# Patient Record
Sex: Female | Born: 1970 | Race: White | Hispanic: No | Marital: Married | Smoking: Never smoker
Health system: Southern US, Community
[De-identification: ages and names within clinical notes are randomized; demographics above are authoritative.]

## PROBLEM LIST (undated history)

## (undated) DIAGNOSIS — G43909 Migraine, unspecified, not intractable, without status migrainosus: Secondary | ICD-10-CM

## (undated) DIAGNOSIS — N750 Cyst of Bartholin's gland: Secondary | ICD-10-CM

## (undated) HISTORY — PX: APPENDECTOMY: SHX54

## (undated) HISTORY — PX: LASIK: SHX215

## (undated) HISTORY — DX: Cyst of Bartholin's gland: N75.0

## (undated) HISTORY — PX: TONSILLECTOMY: SUR1361

## (undated) HISTORY — PX: BREAST BIOPSY: SHX20

---

## 2005-05-12 ENCOUNTER — Ambulatory Visit: Payer: Self-pay

## 2005-05-28 ENCOUNTER — Ambulatory Visit: Payer: Self-pay

## 2005-11-20 ENCOUNTER — Ambulatory Visit: Payer: Self-pay | Admitting: General Surgery

## 2006-06-15 ENCOUNTER — Ambulatory Visit: Payer: Self-pay | Admitting: General Surgery

## 2006-12-31 ENCOUNTER — Encounter: Admission: RE | Admit: 2006-12-31 | Discharge: 2006-12-31 | Payer: Self-pay | Admitting: Family Medicine

## 2007-06-28 ENCOUNTER — Ambulatory Visit: Payer: Self-pay

## 2008-07-04 ENCOUNTER — Ambulatory Visit: Payer: Self-pay

## 2008-07-24 ENCOUNTER — Ambulatory Visit: Payer: Self-pay | Admitting: Unknown Physician Specialty

## 2008-07-31 ENCOUNTER — Ambulatory Visit: Payer: Self-pay | Admitting: Unknown Physician Specialty

## 2009-07-09 ENCOUNTER — Ambulatory Visit: Payer: Self-pay

## 2009-11-25 ENCOUNTER — Ambulatory Visit: Payer: Self-pay | Admitting: Family Medicine

## 2010-07-15 ENCOUNTER — Ambulatory Visit: Payer: Self-pay | Admitting: Unknown Physician Specialty

## 2012-01-08 ENCOUNTER — Ambulatory Visit: Payer: Self-pay | Admitting: Orthopedic Surgery

## 2012-01-25 ENCOUNTER — Encounter: Payer: Self-pay | Admitting: Orthopedic Surgery

## 2012-02-14 ENCOUNTER — Encounter: Payer: Self-pay | Admitting: Orthopedic Surgery

## 2012-03-16 ENCOUNTER — Encounter: Payer: Self-pay | Admitting: Orthopedic Surgery

## 2014-12-21 ENCOUNTER — Other Ambulatory Visit: Payer: Self-pay | Admitting: *Deleted

## 2014-12-21 ENCOUNTER — Inpatient Hospital Stay
Admission: RE | Admit: 2014-12-21 | Discharge: 2014-12-21 | Disposition: A | Discharge status: Home / Self Care | Payer: Self-pay | Source: Ambulatory Visit | Attending: *Deleted | Admitting: *Deleted

## 2014-12-21 DIAGNOSIS — Z9289 Personal history of other medical treatment: Secondary | ICD-10-CM

## 2015-01-11 ENCOUNTER — Ambulatory Visit
Admission: EM | Admit: 2015-01-11 | Discharge: 2015-01-11 | Disposition: A | Discharge status: Home / Self Care | Payer: BC Managed Care – PPO | Attending: Family Medicine | Admitting: Family Medicine

## 2015-01-11 ENCOUNTER — Encounter: Payer: Self-pay | Admitting: Emergency Medicine

## 2015-01-11 DIAGNOSIS — J0101 Acute recurrent maxillary sinusitis: Secondary | ICD-10-CM | POA: Diagnosis not present

## 2015-01-11 DIAGNOSIS — H6593 Unspecified nonsuppurative otitis media, bilateral: Secondary | ICD-10-CM | POA: Diagnosis not present

## 2015-01-11 MED ORDER — AMOXICILLIN-POT CLAVULANATE 875-125 MG PO TABS: 1.0000 | ORAL_TABLET | Freq: Two times a day (BID) | ORAL | Status: DC

## 2015-01-11 MED ORDER — ACETAMINOPHEN 500 MG PO TABS: 1000.0000 mg | ORAL_TABLET | Freq: Four times a day (QID) | ORAL | Status: AC | PRN

## 2015-01-11 MED ORDER — FLUTICASONE PROPIONATE 50 MCG/ACT NA SUSP: 1.0000 | Freq: Two times a day (BID) | NASAL | Status: DC

## 2015-01-11 MED ORDER — SALINE SPRAY 0.65 % NA SOLN
2.0000 | NASAL | Status: DC
Start: 2015-01-11 — End: 2018-09-05

## 2015-01-11 NOTE — ED Provider Notes (Signed)
CSN: [id number]     Arrival date & time 01/11/15  1208 History   First MD Initiated Contact with Patient 01/11/15 1236     Chief Complaint  Patient presents with  . Facial Pain   (Consider location/radiation/quality/duration/timing/severity/associated sxs/prior Treatment) HPI Comments: Left work early today due to headache unable to concentrate.  Both sons sick one with URI and other with GI symtpoms.  Works [employer]  The history is provided by the patient.    History reviewed. No pertinent past medical history. History reviewed. No pertinent past surgical history. History reviewed. No pertinent family history. Social History  Substance Use Topics  . Smoking status: Never Smoker   . Smokeless tobacco: None  . Alcohol Use: Yes   OB History    No data available     Review of Systems  Constitutional: Negative for fever, chills, diaphoresis, activity change, appetite change, fatigue and unexpected weight change.  HENT: Positive for congestion, facial swelling, nosebleeds, postnasal drip, rhinorrhea and sinus pressure. Negative for dental problem, drooling, ear discharge, ear pain, hearing loss, mouth sores, sneezing, sore throat, tinnitus, trouble swallowing and voice change.   Eyes: Negative for photophobia, pain, discharge, redness, itching and visual disturbance.  Respiratory: Negative for cough, choking, chest tightness, shortness of breath, wheezing and stridor.   Cardiovascular: Negative for chest pain, palpitations and leg swelling.  Gastrointestinal: Negative for nausea, vomiting, abdominal pain, diarrhea, constipation, blood in stool, abdominal distention and anal bleeding.  Endocrine: Negative for cold intolerance and heat intolerance.  Genitourinary: Negative for dysuria.  Musculoskeletal: Negative for myalgias, back pain, joint swelling, arthralgias, gait problem, neck pain and neck stiffness.  Skin: Negative for color change, pallor, rash and wound.   Allergic/Immunologic: Positive for environmental allergies. Negative for food allergies and immunocompromised state.  Neurological: Positive for headaches. Negative for dizziness, tremors, seizures, syncope, facial asymmetry, speech difficulty, weakness, light-headedness and numbness.  Hematological: Negative for adenopathy. Does not bruise/bleed easily.  Psychiatric/Behavioral: Negative for behavioral problems, confusion, sleep disturbance and agitation.    Allergies  Review of patient's allergies indicates no known allergies.  Home Medications   Prior to Admission medications   Medication Sig Start Date End Date Taking? Authorizing Provider  almotriptan (AXERT) 12.5 MG tablet Take 12.5 mg by mouth as needed for migraine. may repeat in 2 hours if needed   Yes Historical Provider, MD  FLUoxetine (PROZAC) 40 MG capsule Take 40 mg by mouth daily.   Yes Historical Provider, MD  topiramate (TOPAMAX) 50 MG tablet Take 50 mg by mouth 2 (two) times daily.   Yes Historical Provider, MD  acetaminophen (TYLENOL) 500 MG tablet Take 2 tablets (1,000 mg total) by mouth every 6 (six) hours as needed for moderate pain, fever or headache. 01/11/15   Barbaraann Barthel, NP  amoxicillin-clavulanate (AUGMENTIN) 875-125 MG tablet Take 1 tablet by mouth every 12 (twelve) hours. 01/11/15   Barbaraann Barthel, NP  fluticasone (FLONASE) 50 MCG/ACT nasal spray Place 1 spray into both nostrils 2 (two) times daily. 01/11/15   Barbaraann Barthel, NP  sodium chloride (OCEAN) 0.65 % SOLN nasal spray Place 2 sprays into both nostrils every 2 (two) hours while awake. 01/11/15   Barbaraann Barthel, NP   Meds Ordered and Administered this Visit  Medications - No data to display  BP 136/80 mmHg  Pulse 69  Temp(Src) 97.8 F (36.6 C) (Tympanic)  Resp 20  Ht  (1.727 m)  Wt 268 lb (121.564 kg)  BMI 40.76  kg/m2  SpO2 100%  LMP 10/30/2014 No data found.   Physical Exam  Constitutional: She is oriented to person,  place, and time. Vital signs are normal. She appears well-developed and well-nourished.  HENT:  Head: Normocephalic and atraumatic.  Right Ear: Hearing, external ear and ear canal normal. A middle ear effusion is present.  Left Ear: Hearing, external ear and ear canal normal. A middle ear effusion is present.  Nose: Mucosal edema, rhinorrhea and sinus tenderness present. No nose lacerations, nasal deformity, septal deviation or nasal septal hematoma. No epistaxis.  No foreign bodies. Right sinus exhibits no maxillary sinus tenderness and no frontal sinus tenderness. Left sinus exhibits maxillary sinus tenderness. Left sinus exhibits no frontal sinus tenderness.  Mouth/Throat: Uvula is midline and mucous membranes are normal. Mucous membranes are not pale, not dry and not cyanotic. She does not have dentures. No oral lesions. No trismus in the jaw. Normal dentition. No dental abscesses, uvula swelling, lacerations or dental caries. Posterior oropharyngeal edema and posterior oropharyngeal erythema present. No oropharyngeal exudate or tonsillar abscesses.  Cobblestoning posterior pharynx; bilateral TMs with air fluid level left slight opacity; left maxillary TTP and slight facial swelling  Eyes: Conjunctivae, EOM and lids are normal. Pupils are equal, round, and reactive to light. Right eye exhibits no chemosis, no discharge, no exudate and no hordeolum. No foreign body present in the right eye. Left eye exhibits no chemosis, no discharge, no exudate and no hordeolum. No foreign body present in the left eye. Right conjunctiva is not injected. Right conjunctiva has no hemorrhage. Left conjunctiva is not injected. Left conjunctiva has no hemorrhage. No scleral icterus. Right eye exhibits normal extraocular motion and no nystagmus. Left eye exhibits normal extraocular motion and no nystagmus. Right pupil is round and reactive. Left pupil is round and reactive. Pupils are equal.  Neck: Trachea normal and normal  range of motion. Neck supple. No tracheal tenderness, no spinous process tenderness and no muscular tenderness present. No rigidity. No tracheal deviation, no edema, no erythema and normal range of motion present. No thyroid mass and no thyromegaly present.  Cardiovascular: Normal rate, regular rhythm, S1 normal, S2 normal, normal heart sounds and intact distal pulses.  PMI is not displaced.   Pulmonary/Chest: Effort normal and breath sounds normal. No accessory muscle usage or stridor. No respiratory distress. She has no decreased breath sounds. She has no wheezes. She has no rhonchi. She has no rales.  Abdominal: Soft. Bowel sounds are normal.  Musculoskeletal: Normal range of motion.  Lymphadenopathy:       Head (right side): No submental, no submandibular, no tonsillar, no preauricular, no posterior auricular and no occipital adenopathy present.       Head (left side): No submental, no submandibular, no tonsillar, no preauricular, no posterior auricular and no occipital adenopathy present.    She has no cervical adenopathy.       Right cervical: No superficial cervical, no deep cervical and no posterior cervical adenopathy present.      Left cervical: No superficial cervical, no deep cervical and no posterior cervical adenopathy present.  Neurological: She is alert and oriented to person, place, and time. She has normal strength. She is not disoriented. She displays no atrophy and no tremor. No cranial nerve deficit or sensory deficit. She exhibits normal muscle tone. She displays no seizure activity. Coordination and gait normal. GCS eye subscore is 4. GCS verbal subscore is 5. GCS motor subscore is 6.  Skin: Skin is warm, dry and  intact. No abrasion, no bruising, no burn, no ecchymosis, no laceration, no lesion, no petechiae and no rash noted. She is not diaphoretic. No cyanosis or erythema. No pallor. Nails show no clubbing.  Psychiatric: She has a normal mood and affect. Her speech is normal and  behavior is normal. Judgment and thought content normal. Cognition and memory are normal.  Nursing note and vitals reviewed.   ED Course  Procedures (including critical care time)  Labs Review Labs Reviewed - No data to display  Imaging Review No results found.   MDM   1. Acute recurrent maxillary sinusitis   2. Otitis media with effusion, bilateral   work excuse x 24 hours given to patient.  flonase 1 spray each nostril bid.  Nasal saline 1-2sprays every 2 hours while awake prn congestion.  Afrin 1 spray each nostril BID x 3 days maximum.  augmentin 875mg po BID x 10 days.  Tylenol 1000mg  po QID prn pain/headache, hydrate.  Shower twice a day.  Use saline to wash out nasal mucous before using flonase or afrin.  No evidence of systemic bacterial infection, non toxic and well hydrated.  I do not see where any further testing or imaging is necessary at this time.   I will suggest supportive care, rest, good hygiene and encourage the patient to take adequate fluids.  The patient is to return to clinic or EMERGENCY ROOM if symptoms worsen or change significantly.  Exitcare handout on sinusitis given to patient.  Patient verbalized agreement and understanding of treatment plan and had no further questions at this time.   P2:  Hand washing and cover cough  Supportive treatment.   No evidence of invasive bacterial infection, non toxic and well hydrated.  This is most likely self limiting viral infection.  I do not see where any further testing or imaging is necessary at this time.   I will suggest supportive care, rest, good hygiene and encourage the patient to take adequate fluids.  The patient is to return to clinic or EMERGENCY ROOM if symptoms worsen or change significantly e.g. ear pain, fever, purulent discharge from ears or bleeding.  Exitcare handout on otitis media with effusion given to patient.  Patient verbalized agreement and understanding of treatment plan.      Barbaraann Barthelina A Betancourt,  NP 01/11/15 1422

## 2015-01-11 NOTE — ED Notes (Signed)
Pt wiyh face pressure and pressure x 2 days

## 2015-01-11 NOTE — Discharge Instructions (Signed)
Otitis Media With Effusion Otitis media with effusion is the presence of fluid in the middle ear. This is a common problem in children, which often follows ear infections. It may be present for weeks or longer after the infection. Unlike an acute ear infection, otitis media with effusion refers only to fluid behind the ear drum and not infection. Children with repeated ear and sinus infections and allergy problems are the most likely to get otitis media with effusion. CAUSES  The most frequent cause of the fluid buildup is dysfunction of the eustachian tubes. These are the tubes that drain fluid in the ears to the back of the nose (nasopharynx). SYMPTOMS   The main symptom of this condition is hearing loss. As a result, you or your child may:  Listen to the TV at a loud volume.  Not respond to questions.  Ask "what" often when spoken to.  Mistake or confuse one sound or word for another.  There may be a sensation of fullness or pressure but usually not pain. DIAGNOSIS   Your health care provider will diagnose this condition by examining you or your child's ears.  Your health care provider may test the pressure in you or your child's ear with a tympanometer.  A hearing test may be conducted if the problem persists. TREATMENT   Treatment depends on the duration and the effects of the effusion.  Antibiotics, decongestants, nose drops, and cortisone-type drugs (tablets or nasal spray) may not be helpful.  Children with persistent ear effusions may have delayed language or behavioral problems. Children at risk for developmental delays in hearing, learning, and speech may require referral to a specialist earlier than children not at risk.  You or your child's health care provider may suggest a referral to an ear, nose, and throat surgeon for treatment. The following may help restore normal hearing:  Drainage of fluid.  Placement of ear tubes (tympanostomy tubes).  Removal of adenoids  (adenoidectomy). HOME CARE INSTRUCTIONS   Avoid secondhand smoke.  Infants who are breastfed are less likely to have this condition.  Avoid feeding infants while they are lying flat.  Avoid known environmental allergens.  Avoid people who are sick. SEEK MEDICAL CARE IF:   Hearing is not better in 3 months.  Hearing is worse.  Ear pain.  Drainage from the ear.  Dizziness. MAKE SURE YOU:   Understand these instructions.  Will watch your condition.  Will get help right away if you are not doing well or get worse.   This information is not intended to replace advice given to you by your health care provider. Make sure you discuss any questions you have with your health care provider.   Document Released: 04/09/2004 Document Revised: 03/23/2014 Document Reviewed: 09/27/2012 Elsevier Interactive Patient Education 2016 [employer] Sinusitis, Adult Sinusitis is redness, soreness, and inflammation of the paranasal sinuses. Paranasal sinuses are air pockets within the bones of your face. They are located beneath your eyes, in the middle of your forehead, and above your eyes. In healthy paranasal sinuses, mucus is able to drain out, and air is able to circulate through them by way of your nose. However, when your paranasal sinuses are inflamed, mucus and air can become trapped. This can allow bacteria and other germs to grow and cause infection. Sinusitis can develop quickly and last only a short time (acute) or continue over a long period (chronic). Sinusitis that lasts for more than 12 weeks is considered chronic. CAUSES Causes of sinusitis   include:  Allergies.  Structural abnormalities, such as displacement of the cartilage that separates your nostrils (deviated septum), which can decrease the air flow through your nose and sinuses and affect sinus drainage.  Functional abnormalities, such as when the small hairs (cilia) that line your sinuses and help remove mucus do not work  properly or are not present. SIGNS AND SYMPTOMS Symptoms of acute and chronic sinusitis are the same. The primary symptoms are pain and pressure around the affected sinuses. Other symptoms include:  Upper toothache.  Earache.  Headache.  Bad breath.  Decreased sense of smell and taste.  A cough, which worsens when you are lying flat.  Fatigue.  Fever.  Thick drainage from your nose, which often is green and may contain pus (purulent).  Swelling and warmth over the affected sinuses. DIAGNOSIS Your health care provider will perform a physical exam. During your exam, your health care provider may perform any of the following to help determine if you have acute sinusitis or chronic sinusitis:  Look in your nose for signs of abnormal growths in your nostrils (nasal polyps).  Tap over the affected sinus to check for signs of infection.  View the inside of your sinuses using an imaging device that has a light attached (endoscope). If your health care provider suspects that you have chronic sinusitis, one or more of the following tests may be recommended:  Allergy tests.  Nasal culture. A sample of mucus is taken from your nose, sent to a lab, and screened for bacteria.  Nasal cytology. A sample of mucus is taken from your nose and examined by your health care provider to determine if your sinusitis is related to an allergy. TREATMENT Most cases of acute sinusitis are related to a viral infection and will resolve on their own within 10 days. Sometimes, medicines are prescribed to help relieve symptoms of both acute and chronic sinusitis. These may include pain medicines, decongestants, nasal steroid sprays, or saline sprays. However, for sinusitis related to a bacterial infection, your health care provider will prescribe antibiotic medicines. These are medicines that will help kill the bacteria causing the infection. Rarely, sinusitis is caused by a fungal infection. In these cases,  your health care provider will prescribe antifungal medicine. For some cases of chronic sinusitis, surgery is needed. Generally, these are cases in which sinusitis recurs more than 3 times per year, despite other treatments. HOME CARE INSTRUCTIONS  Drink plenty of water. Water helps thin the mucus so your sinuses can drain more easily.  Use a humidifier.  Inhale steam 3-4 times a day (for example, sit in the bathroom with the shower running).  Apply a warm, moist washcloth to your face 3-4 times a day, or as directed by your health care provider.  Use saline nasal sprays to help moisten and clean your sinuses.  Take medicines only as directed by your health care provider.  If you were prescribed either an antibiotic or antifungal medicine, finish it all even if you start to feel better. SEEK IMMEDIATE MEDICAL CARE IF:  You have increasing pain or severe headaches.  You have nausea, vomiting, or drowsiness.  You have swelling around your face.  You have vision problems.  You have a stiff neck.  You have difficulty breathing.   This information is not intended to replace advice given to you by your health care provider. Make sure you discuss any questions you have with your health care provider.   Document Released: 03/02/2005 Document Revised: 03/23/2014   Document Reviewed: 03/17/2011 Elsevier Interactive Patient Education 2016 [employer]  

## 2015-09-18 ENCOUNTER — Emergency Department: Payer: BC Managed Care – PPO

## 2015-09-18 ENCOUNTER — Encounter: Payer: Self-pay | Admitting: Emergency Medicine

## 2015-09-18 ENCOUNTER — Emergency Department
Admission: EM | Admit: 2015-09-18 | Discharge: 2015-09-19 | Disposition: A | Discharge status: Home / Self Care | Payer: BC Managed Care – PPO | Attending: Emergency Medicine | Admitting: Emergency Medicine

## 2015-09-18 DIAGNOSIS — G43009 Migraine without aura, not intractable, without status migrainosus: Secondary | ICD-10-CM

## 2015-09-18 DIAGNOSIS — Z7951 Long term (current) use of inhaled steroids: Secondary | ICD-10-CM | POA: Diagnosis not present

## 2015-09-18 DIAGNOSIS — Z792 Long term (current) use of antibiotics: Secondary | ICD-10-CM | POA: Insufficient documentation

## 2015-09-18 DIAGNOSIS — G43909 Migraine, unspecified, not intractable, without status migrainosus: Secondary | ICD-10-CM | POA: Diagnosis not present

## 2015-09-18 DIAGNOSIS — Z79899 Other long term (current) drug therapy: Secondary | ICD-10-CM | POA: Diagnosis not present

## 2015-09-18 DIAGNOSIS — R112 Nausea with vomiting, unspecified: Secondary | ICD-10-CM | POA: Diagnosis present

## 2015-09-18 HISTORY — DX: Migraine, unspecified, not intractable, without status migrainosus: G43.909

## 2015-09-18 MED ORDER — PROCHLORPERAZINE EDISYLATE 5 MG/ML IJ SOLN: 10.0000 mg | Freq: Once | INTRAMUSCULAR | Status: DC

## 2015-09-18 MED ORDER — DEXAMETHASONE SODIUM PHOSPHATE 10 MG/ML IJ SOLN
10.0000 mg | Freq: Once | INTRAMUSCULAR | Status: AC
  Administered 2015-09-18: 10 mg via INTRAVENOUS
  Filled 2015-09-18: qty 1

## 2015-09-18 MED ORDER — METOCLOPRAMIDE HCL 5 MG/ML IJ SOLN
10.0000 mg | INTRAMUSCULAR | Status: AC
  Administered 2015-09-18: 10 mg via INTRAVENOUS
  Filled 2015-09-18: qty 2

## 2015-09-18 MED ORDER — DIPHENHYDRAMINE HCL 50 MG/ML IJ SOLN
25.0000 mg | Freq: Once | INTRAMUSCULAR | Status: AC
  Administered 2015-09-18: 25 mg via INTRAVENOUS
  Filled 2015-09-18: qty 1

## 2015-09-18 MED ORDER — SODIUM CHLORIDE 0.9 % IV SOLN
Freq: Once | INTRAVENOUS | Status: AC
Start: 2015-09-18 — End: 2015-09-19
  Administered 2015-09-18: 23:00:00 via INTRAVENOUS

## 2015-09-18 MED ORDER — KETOROLAC TROMETHAMINE 30 MG/ML IJ SOLN
15.0000 mg | Freq: Once | INTRAMUSCULAR | Status: AC
  Administered 2015-09-18: 15 mg via INTRAVENOUS
  Filled 2015-09-18: qty 1

## 2015-09-18 NOTE — ED Provider Notes (Signed)
[facility] Emergency Department Provider Note  ____________________________________________  Time seen: Approximately 10:54 PM  I have reviewed the triage vital signs and the nursing notes.   HISTORY  Chief Complaint Migraine    HPI Judith Barrett is a 45 y.o. female with a history of migraine headaches who is followed by Dr. Clelia Croft of neurology.  She presents today for a gradual onset left-sided migraine that is sharp and throbbing and severe in intensity.  It started out mild earlier in the day and is steadily gotten worse.  It is exacerbated by bright lights and loud noises and movement of her body.  She became nauseated in the afternoon and has vomited multiple times in the evening.  She states it feels just like her prior migraines except that it is more intense in severity and the nausea and vomiting are worse.  She denies fever/chills, neck stiffness, chest pain, shortness of breath, abdominal pain, dysuria.  She states that the pain radiates from the left side of her head down into her neck but that her neck itself is not sore.He has had no recent traumas.  She has had no numbness nor tingling in any of her extremities and no extremity weakness.  She is not having any difficulty with ambulation and no balance issues.  She has had no visual deficits, just the photophobia.  Nothing is making her symptoms better.   Past Medical History  Diagnosis Date  . Migraines     There are no active problems to display for this patient.   Past Surgical History  Procedure Laterality Date  . Cesarean section    . Tonsillectomy    . Lasik    . Appendectomy      Current Outpatient Rx  Name  Route  Sig  Dispense  Refill  . acetaminophen (TYLENOL) 500 MG tablet   Oral   Take 2 tablets (1,000 mg total) by mouth every 6 (six) hours as needed for moderate pain, fever or headache.   30 tablet   0   . almotriptan (AXERT) 12.5 MG tablet   Oral   Take 12.5 mg  by mouth as needed for migraine. may repeat in 2 hours if needed         . amoxicillin-clavulanate (AUGMENTIN) 875-125 MG tablet   Oral   Take 1 tablet by mouth every 12 (twelve) hours.   20 tablet   0   . FLUoxetine (PROZAC) 40 MG capsule   Oral   Take 40 mg by mouth daily.         . fluticasone (FLONASE) 50 MCG/ACT nasal spray   Each Nare   Place 1 spray into both nostrils 2 (two) times daily.   16 g   0   . sodium chloride (OCEAN) 0.65 % SOLN nasal spray   Each Nare   Place 2 sprays into both nostrils every 2 (two) hours while awake.      0   . topiramate (TOPAMAX) 50 MG tablet   Oral   Take 50 mg by mouth 2 (two) times daily.           Allergies Review of patient's allergies indicates no known allergies.  No family history on file.  Social History Social History  Substance Use Topics  . Smoking status: Never Smoker   . Smokeless tobacco: None  . Alcohol Use: Yes    Review of Systems Constitutional: No fever/chills Eyes: No visual changes. Has photophobia ENT: No sore  throat. Cardiovascular: Denies chest pain. Respiratory: Denies shortness of breath. Gastrointestinal: No abdominal pain.  Multiple episodes of vomiting with persistent nausea.  No diarrhea.  No constipation. Genitourinary: Negative for dysuria. Musculoskeletal: Negative for back pain. Skin: Negative for rash. Neurological: Severe left-sided migraine that was gradual in onset since earlier today  10-point ROS otherwise negative.  ____________________________________________   PHYSICAL EXAM:  VITAL SIGNS: ED Triage Vitals  Enc Vitals Group     BP 09/18/15 2240 124/76 mmHg     Pulse Rate 09/18/15 2240 64     Resp 09/18/15 2240 18     Temp 09/18/15 2240 97.7 F (36.5 C)     Temp Source 09/18/15 2240 Oral     SpO2 09/18/15 2240 100 %     Weight 09/18/15 2240 280 lb (127.007 kg)     Height 09/18/15 2240 5\' 8"  (1.727 m)     Head Cir --      Peak Flow --      Pain Score  09/18/15 2241 9     Pain Loc --      Pain Edu? --      Excl. in GC? --     Constitutional: Alert and oriented. Appears uncomfortable and is vomiting occasionally, but overall is nontoxic in appearance Eyes: Conjunctivae are normal. PERRL. EOMI. no papilledema.  No nystagmus Head: Atraumatic. Nose: No congestion/rhinnorhea. Mouth/Throat: Mucous membranes are moist.  Oropharynx non-erythematous. Neck: No stridor.  No meningeal signs.   Cardiovascular: Normal rate, regular rhythm. Good peripheral circulation. Grossly normal heart sounds.   Respiratory: Normal respiratory effort.  No retractions. Lungs CTAB. Gastrointestinal: Soft and nontender. No distention.  Musculoskeletal: No lower extremity tenderness nor edema. No gross deformities of extremities. Neurologic:  Normal speech and language. No gross focal neurologic deficits are appreciated.  Skin:  Skin is warm, dry and intact. No rash noted. Psychiatric: Mood and affect are normal. Speech and behavior are normal.  ____________________________________________   LABS (all labs ordered are listed, but only abnormal results are displayed)  Labs Reviewed - No data to display ____________________________________________  EKG  None ____________________________________________  RADIOLOGY   No results found.  ____________________________________________   PROCEDURES  Procedure(s) performed:   Procedures   ____________________________________________   INITIAL IMPRESSION / ASSESSMENT AND PLAN / ED COURSE  Pertinent labs & imaging results that were available during my care of the patient were reviewed by me and considered in my medical decision making (see chart for details).  The patient has had multiple headaches in the past and sees a neurologist.  I see that she has never had any imaging of her head.  We discussed the possibility of a noncontrast head CT though I do have very low suspicion for acute intracranial  pathology.  We decided to treat her first and see if she improves and reserve the right to order imaging if she does not improve with medication treatment.  I think this is appropriate as I have a low suspicion for intracranial bleed, mass lesion, etc.  It is also doubtful based on her reassuring exam and the nature of her symptoms that she suffers from pseudotumor cerebri.  I will treat her with my usual "migraine cocktail" and reassess.   (Note that documentation was delayed due to multiple ED patients requiring immediate care.)   The patient had a complete resolution of her headache after treatment.  She is smiling and feels much better and states she is ready to go home.  We will defer  imaging at this time.  Since she has seen Dr. Clelia CroftShaw in the past and recommended she call his office to discuss a follow-up appointment.  I gave my usual and customary return precautions.     ____________________________________________  FINAL CLINICAL IMPRESSION(S) / ED DIAGNOSES  Final diagnoses:  Nonintractable migraine, unspecified migraine type     MEDICATIONS GIVEN DURING THIS VISIT:  Medications  0.9 %  sodium chloride infusion ( Intravenous Stopped 09/19/15 0024)  ketorolac (TORADOL) 30 MG/ML injection 15 mg (15 mg Intravenous Given 09/18/15 2313)  dexamethasone (DECADRON) injection 10 mg (10 mg Intravenous Given 09/18/15 2315)  metoCLOPramide (REGLAN) injection 10 mg (10 mg Intravenous Given 09/18/15 2313)  diphenhydrAMINE (BENADRYL) injection 25 mg (25 mg Intravenous Given 09/18/15 2314)     NEW OUTPATIENT MEDICATIONS STARTED DURING THIS VISIT:  Discharge Medication List as of 09/19/2015  1:59 AM        Note:  This document was prepared using Dragon voice recognition software and may include unintentional dictation errors.   Loleta Roseory Yanissa Michalsky, MD 09/19/15 [id number]

## 2015-09-18 NOTE — ED Notes (Signed)
Patient presents to ED with c/o migraine and neck pain since today, pt reports nausea and vomited x 5-6. Pt reports hx of migraine. Pt c/o left sided migraine. Pt alert and oriented x 4, no increased work in breathing. Pt speaking in complete and clear voice.

## 2015-09-19 NOTE — Discharge Instructions (Signed)
You have been seen in the Emergency Department (ED) for a migraine.  Please use Tylenol or Motrin as needed for symptoms, but only as written on the box, and take any regular medications that have been prescribed for you. °As we have discussed, please follow up with your doctor as soon as possible regarding today’s ED visit and your headache symptoms.   ° °Call your doctor or return to the Emergency Department (ED) if you have a worsening headache, sudden and severe headache, confusion, slurred speech, facial droop, weakness or numbness in any arm or leg, extreme fatigue, or other symptoms that concern you. ° ° °Migraine Headache °A migraine headache is an intense, throbbing pain on one or both sides of your head. A migraine can last for 30 minutes to several hours. °CAUSES  °The exact cause of a migraine headache is not always known. However, a migraine may be caused when nerves in the brain become irritated and release chemicals that cause inflammation. This causes pain. °Certain things may also trigger migraines, such as: °· Alcohol. °· Smoking. °· Stress. °· Menstruation. °· Aged cheeses. °· Foods or drinks that contain nitrates, glutamate, aspartame, or tyramine. °· Lack of sleep. °· Chocolate. °· Caffeine. °· Hunger. °· Physical exertion. °· Fatigue. °· Medicines used to treat chest pain (nitroglycerine), birth control pills, estrogen, and some blood pressure medicines. °SIGNS AND SYMPTOMS °· Pain on one or both sides of your head. °· Pulsating or throbbing pain. °· Severe pain that prevents daily activities. °· Pain that is aggravated by any physical activity. °· Nausea, vomiting, or both. °· Dizziness. °· Pain with exposure to bright lights, loud noises, or activity. °· General sensitivity to bright lights, loud noises, or smells. °Before you get a migraine, you may get warning signs that a migraine is coming (aura). An aura may include: °· Seeing flashing lights. °· Seeing bright spots, halos, or zigzag  lines. °· Having tunnel vision or blurred vision. °· Having feelings of numbness or tingling. °· Having trouble talking. °· Having muscle weakness. °DIAGNOSIS  °A migraine headache is often diagnosed based on: °· Symptoms. °· Physical exam. °· A CT scan or MRI of your head. These imaging tests cannot diagnose migraines, but they can help rule out other causes of headaches. °TREATMENT °Medicines may be given for pain and nausea. Medicines can also be given to help prevent recurrent migraines.  °HOME CARE INSTRUCTIONS °· Only take over-the-counter or prescription medicines for pain or discomfort as directed by your health care provider. The use of long-term narcotics is not recommended. °· Lie down in a dark, quiet room when you have a migraine. °· Keep a journal to find out what may trigger your migraine headaches. For example, write down: °¨ What you eat and drink. °¨ How much sleep you get. °¨ Any change to your diet or medicines. °· Limit alcohol consumption. °· Quit smoking if you smoke. °· Get 7-9 hours of sleep, or as recommended by your health care provider. °· Limit stress. °· Keep lights dim if bright lights bother you and make your migraines worse. °SEEK IMMEDIATE MEDICAL CARE IF:  °· Your migraine becomes severe. °· You have a fever. °· You have a stiff neck. °· You have vision loss. °· You have muscular weakness or loss of muscle control. °· You start losing your balance or have trouble walking. °· You feel faint or pass out. °· You have severe symptoms that are different from your first symptoms. °MAKE SURE YOU:  °·   Understand these instructions. °· Will watch your condition. °· Will get help right away if you are not doing well or get worse. °  °This information is not intended to replace advice given to you by your health care provider. Make sure you discuss any questions you have with your health care provider. °  °Document Released: 03/02/2005 Document Revised: 03/23/2014 Document Reviewed:  11/07/2012 °Elsevier Interactive Patient Education ©2016 [employer] ° ° ° °

## 2015-12-12 DIAGNOSIS — G43019 Migraine without aura, intractable, without status migrainosus: Secondary | ICD-10-CM | POA: Insufficient documentation

## 2015-12-14 DIAGNOSIS — Z6841 Body Mass Index (BMI) 40.0 and over, adult: Secondary | ICD-10-CM | POA: Insufficient documentation

## 2016-03-12 ENCOUNTER — Encounter: Payer: Self-pay | Admitting: *Deleted

## 2016-03-20 ENCOUNTER — Encounter
Admission: RE | Disposition: A | Discharge status: Home / Self Care | Payer: Self-pay | Source: Ambulatory Visit | Attending: Unknown Physician Specialty

## 2016-03-20 ENCOUNTER — Ambulatory Visit
Admission: RE | Admit: 2016-03-20 | Discharge: 2016-03-20 | Disposition: A | Discharge status: Home / Self Care | Payer: BC Managed Care – PPO | Source: Ambulatory Visit | Attending: Unknown Physician Specialty | Admitting: Unknown Physician Specialty

## 2016-03-20 ENCOUNTER — Ambulatory Visit: Payer: BC Managed Care – PPO | Admitting: Student in an Organized Health Care Education/Training Program

## 2016-03-20 DIAGNOSIS — J342 Deviated nasal septum: Secondary | ICD-10-CM | POA: Diagnosis present

## 2016-03-20 DIAGNOSIS — R51 Headache: Secondary | ICD-10-CM | POA: Diagnosis not present

## 2016-03-20 DIAGNOSIS — J343 Hypertrophy of nasal turbinates: Secondary | ICD-10-CM | POA: Insufficient documentation

## 2016-03-20 DIAGNOSIS — J320 Chronic maxillary sinusitis: Secondary | ICD-10-CM | POA: Diagnosis not present

## 2016-03-20 DIAGNOSIS — J3489 Other specified disorders of nose and nasal sinuses: Secondary | ICD-10-CM | POA: Diagnosis not present

## 2016-03-20 HISTORY — PX: SEPTOPLASTY: SHX2393

## 2016-03-20 HISTORY — PX: TURBINATE REDUCTION: SHX6157

## 2016-03-20 HISTORY — PX: IMAGE GUIDED SINUS SURGERY: SHX6570

## 2016-03-20 HISTORY — PX: MAXILLARY ANTROSTOMY: SHX2003

## 2016-03-20 SURGERY — SINUS SURGERY, WITH IMAGING GUIDANCE
Anesthesia: General | Site: Nose | Wound class: Clean Contaminated

## 2016-03-20 MED ORDER — OXYMETAZOLINE HCL 0.05 % NA SOLN
6.0000 | Freq: Once | NASAL | Status: AC
  Administered 2016-03-20: 6 via NASAL

## 2016-03-20 MED ORDER — ACETAMINOPHEN 10 MG/ML IV SOLN
1000.0000 mg | Freq: Once | INTRAVENOUS | Status: AC
  Administered 2016-03-20: 1000 mg via INTRAVENOUS

## 2016-03-20 MED ORDER — ONDANSETRON HCL 4 MG/2ML IJ SOLN: 4.0000 mg | Freq: Once | INTRAMUSCULAR | Status: DC | PRN

## 2016-03-20 MED ORDER — FENTANYL CITRATE (PF) 100 MCG/2ML IJ SOLN
INTRAMUSCULAR | Status: DC | PRN
  Administered 2016-03-20: 100 ug via INTRAVENOUS

## 2016-03-20 MED ORDER — OXYCODONE-ACETAMINOPHEN 5-325 MG PO TABS: 1.0000 | ORAL_TABLET | ORAL | 0 refills | Status: DC | PRN

## 2016-03-20 MED ORDER — DEXAMETHASONE SODIUM PHOSPHATE 4 MG/ML IJ SOLN
INTRAMUSCULAR | Status: DC | PRN
  Administered 2016-03-20: 10 mg via INTRAVENOUS

## 2016-03-20 MED ORDER — OXYCODONE HCL 5 MG PO TABS
5.0000 mg | ORAL_TABLET | Freq: Once | ORAL | Status: AC | PRN
  Administered 2016-03-20: 5 mg via ORAL

## 2016-03-20 MED ORDER — SCOPOLAMINE 1 MG/3DAYS TD PT72
1.0000 | MEDICATED_PATCH | TRANSDERMAL | Status: DC
  Administered 2016-03-20: 1.5 mg via TRANSDERMAL

## 2016-03-20 MED ORDER — ONDANSETRON HCL 4 MG/2ML IJ SOLN
INTRAMUSCULAR | Status: DC | PRN
  Administered 2016-03-20: 4 mg via INTRAVENOUS

## 2016-03-20 MED ORDER — LACTATED RINGERS IV SOLN
INTRAVENOUS | Status: DC
  Administered 2016-03-20 (×2): via INTRAVENOUS

## 2016-03-20 MED ORDER — ACETAMINOPHEN 325 MG PO TABS: 325.0000 mg | ORAL_TABLET | ORAL | Status: DC | PRN

## 2016-03-20 MED ORDER — OXYCODONE HCL 5 MG/5ML PO SOLN: 5.0000 mg | Freq: Once | ORAL | Status: AC | PRN

## 2016-03-20 MED ORDER — HYDROMORPHONE HCL 1 MG/ML IJ SOLN: 0.2500 mg | INTRAMUSCULAR | Status: DC | PRN

## 2016-03-20 MED ORDER — GLYCOPYRROLATE 0.2 MG/ML IJ SOLN
INTRAMUSCULAR | Status: DC | PRN
  Administered 2016-03-20: 0.1 mg via INTRAVENOUS

## 2016-03-20 MED ORDER — LIDOCAINE-EPINEPHRINE 2 %-1:100000 IJ SOLN
INTRAMUSCULAR | Status: DC | PRN
  Administered 2016-03-20: 12 mL

## 2016-03-20 MED ORDER — SULFAMETHOXAZOLE-TRIMETHOPRIM 800-160 MG PO TABS
1.0000 | ORAL_TABLET | Freq: Two times a day (BID) | ORAL | 0 refills | Status: DC
Start: 2016-03-20 — End: 2018-07-27

## 2016-03-20 MED ORDER — MIDAZOLAM HCL 5 MG/5ML IJ SOLN
INTRAMUSCULAR | Status: DC | PRN
  Administered 2016-03-20: 2 mg via INTRAVENOUS

## 2016-03-20 MED ORDER — PROPOFOL 10 MG/ML IV BOLUS
INTRAVENOUS | Status: DC | PRN
  Administered 2016-03-20: 150 mg via INTRAVENOUS

## 2016-03-20 MED ORDER — LIDOCAINE HCL (CARDIAC) 20 MG/ML IV SOLN
INTRAVENOUS | Status: DC | PRN
  Administered 2016-03-20: 40 mg via INTRAVENOUS

## 2016-03-20 MED ORDER — ROCURONIUM BROMIDE 100 MG/10ML IV SOLN
INTRAVENOUS | Status: DC | PRN
  Administered 2016-03-20: 20 mg via INTRAVENOUS

## 2016-03-20 MED ORDER — PHENYLEPHRINE HCL 0.5 % NA SOLN
NASAL | Status: DC | PRN
  Administered 2016-03-20: 30 mL via TOPICAL

## 2016-03-20 MED ORDER — ACETAMINOPHEN 160 MG/5ML PO SOLN: 325.0000 mg | ORAL | Status: DC | PRN

## 2016-03-20 SURGICAL SUPPLY — 34 items
BATTERY INSTRU NAVIGATION (MISCELLANEOUS) ×16 IMPLANT
BLADE SURG 15 STRL LF DISP TIS (BLADE) IMPLANT
BLADE SURG 15 STRL SS (BLADE)
CANISTER SUCT 1200ML W/VALVE (MISCELLANEOUS) ×4 IMPLANT
COAG SUCT 10F 3.5MM HAND CTRL (MISCELLANEOUS) ×4 IMPLANT
CUP MEDICINE 2OZ PLAST GRAD ST (MISCELLANEOUS) ×4 IMPLANT
DRAPE HEAD BAR (DRAPES) ×4 IMPLANT
DRESSING NASL FOAM PST OP SINU (MISCELLANEOUS) IMPLANT
DRSG NASAL FOAM POST OP SINU (MISCELLANEOUS)
GLOVE BIO SURGEON STRL SZ7.5 (GLOVE) ×12 IMPLANT
HANDLE YANKAUER SUCT BULB TIP (MISCELLANEOUS) ×4 IMPLANT
KIT ROOM TURNOVER OR (KITS) ×4 IMPLANT
NAVIGATION MASK REG  ST (MISCELLANEOUS) ×4 IMPLANT
NEEDLE HYPO 25GX1X1/2 BEV (NEEDLE) ×4 IMPLANT
NS IRRIG 500ML POUR BTL (IV SOLUTION) ×4 IMPLANT
PACK DRAPE NASAL/ENT (PACKS) ×4 IMPLANT
PAD GROUND ADULT SPLIT (MISCELLANEOUS) ×4 IMPLANT
SOL ANTI-FOG 6CC FOG-OUT (MISCELLANEOUS) ×2 IMPLANT
SOL FOG-OUT ANTI-FOG 6CC (MISCELLANEOUS) ×2
SPLINT NASAL SEPTAL BLV .25 LG (MISCELLANEOUS) IMPLANT
SPLINT NASAL SEPTAL BLV .50 ST (MISCELLANEOUS) ×4 IMPLANT
SPONGE NEURO XRAY DETECT 1X3 (DISPOSABLE) ×4 IMPLANT
STRAP BODY AND KNEE 60X3 (MISCELLANEOUS) ×4 IMPLANT
SUT CHROMIC 3-0 (SUTURE) ×2
SUT CHROMIC 3-0 KS 27XMFL CR (SUTURE) ×2
SUT CHROMIC 5-0 (SUTURE)
SUT CHROMIC 5-0 P2 18XMFL CR (SUTURE)
SUT ETHILON 3-0 KS 30 BLK (SUTURE) ×4 IMPLANT
SUT PLAIN GUT 4-0 (SUTURE) IMPLANT
SUTURE CHRMC 3-0 KS 27XMFL CR (SUTURE) ×2 IMPLANT
SUTURE CHRMC 5-0 P2 18XMF CR (SUTURE) IMPLANT
SYRINGE 10CC LL (SYRINGE) ×4 IMPLANT
TOWEL OR 17X26 4PK STRL BLUE (TOWEL DISPOSABLE) ×4 IMPLANT
WATER STERILE IRR 250ML POUR (IV SOLUTION) ×4 IMPLANT

## 2016-03-20 NOTE — Anesthesia Preprocedure Evaluation (Signed)
Anesthesia Evaluation  Patient identified by MRN, date of birth, ID band Patient awake    Reviewed: Allergy & Precautions, H&P , NPO status , Patient's Chart, lab work & pertinent test results, reviewed documented beta blocker date and time   Airway Mallampati: I  TM Distance: >3 FB Neck ROM: full    Dental no notable dental hx.    Pulmonary neg pulmonary ROS,    Pulmonary exam normal breath sounds clear to auscultation       Cardiovascular Exercise Tolerance: Good negative cardio ROS Normal cardiovascular exam Rhythm:regular Rate:Normal     Neuro/Psych  Headaches, negative psych ROS   GI/Hepatic negative GI ROS, Neg liver ROS,   Endo/Other  negative endocrine ROSMorbid obesity  Renal/GU negative Renal ROS  negative genitourinary   Musculoskeletal   Abdominal   Peds  Hematology negative hematology ROS (+)   Anesthesia Other Findings   Reproductive/Obstetrics negative OB ROS                             Anesthesia Physical Anesthesia Plan  ASA: II  Anesthesia Plan: General   Post-op Pain Management:    Induction:   Airway Management Planned:   Additional Equipment:   Intra-op Plan:   Post-operative Plan:   Informed Consent: I have reviewed the patients History and Physical, chart, labs and discussed the procedure including the risks, benefits and alternatives for the proposed anesthesia with the patient or authorized representative who has indicated his/her understanding and acceptance.   Dental Advisory Given  Plan Discussed with: CRNA and Anesthesiologist  Anesthesia Plan Comments:         Anesthesia Quick Evaluation

## 2016-03-20 NOTE — Discharge Instructions (Signed)
[facility] °[facility] °ENDOSCOPIC SINUS SURGERY °[facility] ° °What is Functional Endoscopic Sinus Surgery? ° The Surgery involves making the natural openings of the sinuses larger by removing the bony partitions that separate the sinuses from the nasal cavity.  The natural sinus lining is preserved as much as possible to allow the sinuses to resume normal function after the surgery.  In some patients nasal polyps (excessively swollen lining of the sinuses) may be removed to relieve obstruction of the sinus openings.  The surgery is performed through the nose using lighted scopes, which eliminates the need for incisions on the face.  A septoplasty is a different procedure which is sometimes performed with sinus surgery.  It involves straightening the boy partition that separates the two sides of your nose.  A crooked or deviated septum may need repair if is obstructing the sinuses or nasal airflow.  Turbinate reduction is also often performed during sinus surgery.  The turbinates are bony proturberances from the side walls of the nose which swell and can obstruct the nose in patients with sinus and allergy problems.  Their size can be surgically reduced to help relieve nasal obstruction. ° °What Can Sinus Surgery Do For Me? ° Sinus surgery can reduce the frequency of sinus infections requiring antibiotic treatment.  This can provide improvement in nasal congestion, post-nasal drainage, facial pressure and nasal obstruction.  Surgery will NOT prevent you from ever having an infection again, so it usually only for patients who get infections 4 or more times yearly requiring antibiotics, or for infections that do not clear with antibiotics.  It will not cure nasal allergies, so patients with allergies may still require medication to treat their allergies after surgery. Surgery may improve headaches related to sinusitis, however, some people will continue to  require medication to control sinus headaches related to allergies.  Surgery will do nothing for other forms of headache (migraine, tension or cluster). ° °What Are the Risks of Endoscopic Sinus Surgery? ° Current techniques allow surgery to be performed safely with little risk, however, there are rare complications that patients should be aware of.  Because the sinuses are located around the eyes, there is risk of eye injury, including blindness, though again, this would be quite rare. This is usually a result of bleeding behind the eye during surgery, which puts the vision oat risk, though there are treatments to protect the vision and prevent permanent disrupted by surgery causing a leak of the spinal fluid that surrounds the brain.  More serious complications would include bleeding inside the brain cavity or damage to the brain.  Again, all of these complications are uncommon, and spinal fluid leaks can be safely managed surgically if they occur.  The most common complication of sinus surgery is bleeding from the nose, which may require packing or cauterization of the nose.  Continued sinus have polyps may experience recurrence of the polyps requiring revision surgery.  Alterations of sense of smell or injury to the tear ducts are also rare complications.  ° °What is the Surgery Like, and what is the Recovery? ° The Surgery usually takes a couple of hours to perform, and is usually performed under a general anesthetic (completely asleep).  Patients are usually discharged home after a couple of hours.  Sometimes during surgery it is necessary to pack the nose to control bleeding, and the packing is left in place for 24 - 48 hours, and removed by your surgeon.    If a septoplasty was performed during the procedure, there is often a splint placed which must be removed after 5-7 days.   °Discomfort: Pain is usually mild to moderate, and can be controlled by prescription pain medication or acetaminophen (Tylenol).   Aspirin, Ibuprofen (Advil, Motrin), or Naprosyn (Aleve) should be avoided, as they can cause increased bleeding.  Most patients feel sinus pressure like they have a bad head cold for several days.  Sleeping with your head elevated can help reduce swelling and facial pressure, as can ice packs over the face.  A humidifier may be helpful to keep the mucous and blood from drying in the nose.  ° °Diet: There are no specific diet restrictions, however, you should generally start with clear liquids and a light diet of bland foods because the anesthetic can cause some nausea.  Advance your diet depending on how your stomach feels.  Taking your pain medication with food will often help reduce stomach upset which pain medications can cause. ° °Nasal Saline Irrigation: It is important to remove blood clots and dried mucous from the nose as it is healing.  This is done by having you irrigate the nose at least 3 - 4 times daily with a salt water solution.  We recommend using NeilMed Sinus Rinse (available at the drug store).  Fill the squeeze bottle with the solution, bend over a sink, and insert the tip of the squeeze bottle into the nose ½ of an inch.  Point the tip of the squeeze bottle towards the inside corner of the eye on the same side your irrigating.  Squeeze the bottle and gently irrigate the nose.  If you bend forward as you do this, most of the fluid will flow back out of the nose, instead of down your throat.   The solution should be warm, near body temperature, when you irrigate.   Each time you irrigate, you should use a full squeeze bottle.  ° °Note that if you are instructed to use Nasal Steroid Sprays at any time after your surgery, irrigate with saline BEFORE using the steroid spray, so you do not wash it all out of the nose. °Another product, Nasal Saline Gel (such as AYR Nasal Saline Gel) can be applied in each nostril 3 - 4 times daily to moisture the nose and reduce scabbing or crusting. ° °Bleeding:   Bloody drainage from the nose can be expected for several days, and patients are instructed to irrigate their nose frequently with salt water to help remove mucous and blood clots.  The drainage may be dark red or brown, though some fresh blood may be seen intermittently, especially after irrigation.  Do not blow you nose, as bleeding may occur. If you must sneeze, keep your mouth open to allow air to escape through your mouth. ° °If heavy bleeding occurs: Irrigate the nose with saline to rinse out clots, then spray the nose 3 - 4 times with Afrin Nasal Decongestant Spray.  The spray will constrict the blood vessels to slow bleeding.  Pinch the lower half of your nose shut to apply pressure, and lay down with your head elevated.  Ice packs over the nose may help as well. If bleeding persists despite these measures, you should notify your doctor.  Do not use the Afrin routinely to control nasal congestion after surgery, as it can result in worsening congestion and may affect healing.  ° ° ° °Activity: Return to work varies among patients. Most patients will be   out of work at least 5 - 7 days to recover.  Patient may return to work after they are off of narcotic pain medication, and feeling well enough to perform the functions of their job.  Patients must avoid heavy lifting (over 10 pounds) or strenuous physical for 2 weeks after surgery, so your employer may need to assign you to light duty, or keep you out of work longer if light duty is not possible.  NOTE: you should not drive, operate dangerous machinery, do any mentally demanding tasks or make any important legal or financial decisions while on narcotic pain medication and recovering from the general anesthetic.    Call Your Doctor Immediately if You Have Any of the Following: 1. Bleeding that you cannot control with the above measures 2. Loss of vision, double vision, bulging of the eye or black eyes. 3. Fever over 101 degrees 4. Neck stiffness with  severe headache, fever, nausea and change in mental state. You are always encourage to call anytime with concerns, however, please call with requests for pain medication refills during office hours.  Office Endoscopy: During follow-up visits your doctor will remove any packing or splints that may have been placed and evaluate and clean your sinuses endoscopically.  Topical anesthetic will be used to make this as comfortable as possible, though you may want to take your pain medication prior to the visit.  How often this will need to be done varies from patient to patient.  After complete recovery from the surgery, you may need follow-up endoscopy from time to time, particularly if there is concern of recurrent infection or nasal polyps.    General Anesthesia, Adult, Care After These instructions provide you with information about caring for yourself after your procedure. Your health care provider may also give you more specific instructions. Your treatment has been planned according to current medical practices, but problems sometimes occur. Call your health care provider if you have any problems or questions after your procedure. What can I expect after the procedure? After the procedure, it is common to have:  Vomiting.  A sore throat.  Mental slowness. It is common to feel:  Nauseous.  Cold or shivery.  Sleepy.  Tired.  Sore or achy, even in parts of your body where you did not have surgery. Follow these instructions at home: For at least 24 hours after the procedure:  Do not:  Participate in activities where you could fall or become injured.  Drive.  Use heavy machinery.  Drink alcohol.  Take sleeping pills or medicines that cause drowsiness.  Make important decisions or sign legal documents.  Take care of children on your own.  Rest. Eating and drinking  If you vomit, drink water, juice, or soup when you can drink without vomiting.  Drink enough fluid to keep  your urine clear or pale yellow.  Make sure you have little or no nausea before eating solid foods.  Follow the diet recommended by your health care provider. General instructions  Have a responsible adult stay with you until you are awake and alert.  Return to your normal activities as told by your health care provider. Ask your health care provider what activities are safe for you.  Take over-the-counter and prescription medicines only as told by your health care provider.  If you smoke, do not smoke without supervision.  Keep all follow-up visits as told by your health care provider. This is important. Contact a health care provider if:  You continue  to have nausea or vomiting at home, and medicines are not helpful.  You cannot drink fluids or start eating again.  You cannot urinate after 8-12 hours.  You develop a skin rash.  You have fever.  You have increasing redness at the site of your procedure. Get help right away if:  You have difficulty breathing.  You have chest pain.  You have unexpected bleeding.  You feel that you are having a life-threatening or urgent problem. This information is not intended to replace advice given to you by your health care provider. Make sure you discuss any questions you have with your health care provider. Document Released: 06/08/2000 Document Revised: 08/05/2015 Document Reviewed: 02/14/2015 Elsevier Interactive Patient Education  2017 Else[employer].  Scopolamine skin patches REMOVE IN 72 HOURS AND WASH HANDS IMMEDIATELY What is this medicine? SCOPOLAMINE (skoe POL a meen) is used to prevent nausea and vomiting caused by motion sickness, anesthesia and surgery. This medicine may be used for other purposes; ask your health care provider or pharmacist if you have questions. COMMON BRAND NAME(S): Transderm Scop What should I tell my health care provider before I take this medicine? They need to know if you have any of these  conditions: -glaucoma -kidney or liver disease -an unusual or allergic reaction (especially skin allergy) to scopolamine, atropine, other medicines, foods, dyes, or preservatives -pregnant or trying to get pregnant -breast-feeding How should I use this medicine? This medicine is for external use only. Follow the directions on the prescription label. One patch contains enough medicine to prevent motion sickness for up to 3 days. Apply the patch at least 4 hours before you need it and only wear one disc at a time. Choose an area behind the ear, that is clean, dry, hairless and free from any cuts or irritation. Wipe the area with a clean dry tissue. Peel off the plastic backing of the skin patch, trying not to touch the adhesive side with your hands. Do not cut the patches. Firmly apply to the area you have chosen, with the metallic side of the patch to the skin and the tan-colored side showing. Once firmly in place, wash your hands well with soap and water. Remove the disc after 3 days, or sooner if you no longer need it. After removing the patch, wash your hands and the area behind your ear thoroughly with soap and water. The patch will still contain some medicine after use. To avoid accidental contact or ingestion by children or pets, fold the used patch in half with the sticky side together and throw away in the trash out of the reach of children and pets. If you need to use a second patch after you remove the first, place it behind the other ear. Talk to your pediatrician regarding the use of this medicine in children. Special care may be needed. Overdosage: If you think you have taken too much of this medicine contact a poison control center or emergency room at once. NOTE: This medicine is only for you. Do not share this medicine with others. What if I miss a dose? Make sure you apply the patch at least 4 hours before you need it. You can apply it the night before traveling. What may interact with  this medicine? -benztropine -bethanechol -medicines for anxiety or sleeping problems like diazepam or temazepam -medicines for hay fever and other allergies -medicines for mental depression -muscle relaxants This list may not describe all possible interactions. Give your health care provider a  list of all the medicines, herbs, non-prescription drugs, or dietary supplements you use. Also tell them if you smoke, drink alcohol, or use illegal drugs. Some items may interact with your medicine. What should I watch for while using this medicine? Keep the patch dry, if possible, to prevent it from falling off. Limited contact with water, however, as in bathing or swimming, will not affect the system. If the patch falls off, throw it away and put a new one behind the other ear. You may get drowsy or dizzy. Do not drive, use machinery, or do anything that needs mental alertness until you know how this medicine affects you. Do not stand or sit up quickly, especially if you are an older patient. This reduces the risk of dizzy or fainting spells. Alcohol may interfere with the effect of this medicine. Avoid alcoholic drinks. Your mouth may get dry. Chewing sugarless gum or sucking hard candy, and drinking plenty of water may help. Contact your doctor if the problem does not go away or is severe. This medicine may cause dry eyes and blurred vision. If you wear contact lenses you may feel some discomfort. Lubricating drops may help. See your eye doctor if the problem does not go away or is severe. If you are going to have a magnetic resonance imaging (MRI) procedure, tell your MRI technician if you have this patch on your body. It must be removed before a MRI. What side effects may I notice from receiving this medicine? Side effects that you should report to your doctor or health care professional as soon as possible: -agitation, nervousness, confusion -blurred vision and other eye problems -dizziness,  drowsiness -eye pain or redness in the whites of the eye -hallucinations -pain or difficulty passing urine -skin rash, itching -vomiting Side effects that usually do not require medical attention (report to your doctor or health care professional if they continue or are bothersome): -headache -nausea This list may not describe all possible side effects. Call your doctor for medical advice about side effects. You may report side effects to FDA at 1-800-FDA-1088. Where should I keep my medicine? Keep out of the reach of children. Store at room temperature between 20 and 25 degrees C (68 and 77 degrees F). Throw away any unused medicine after the expiration date. When you remove a patch, fold it and throw it in the trash as described above. NOTE: This sheet is a summary. It may not cover all possible information. If you have questions about this medicine, talk to your doctor, pharmacist, or health care provider.  2017 Elsevier/Gold Standard (2011-07-30 13:31:48)

## 2016-03-20 NOTE — Transfer of Care (Signed)
Immediate Anesthesia Transfer of Care Note  Patient: Sheila Oatsaphne K Edmondson  Procedure(s) Performed: Procedure(s) with comments: IMAGE GUIDED SINUS SURGERY (N/A) - STRYKER GAVE DISK TO CECE 12-22 KP sent 2nd disk gave to cece 1-3 kp SEPTOPLASTY (N/A) TURBINATE REDUCTION (Bilateral) MAXILLARY ANTROSTOMY WITH TISSUE REMOVAL (Bilateral)  Patient Location: PACU  Anesthesia Type: General  Level of Consciousness: awake, alert  and patient cooperative  Airway and Oxygen Therapy: Patient Spontanous Breathing and Patient connected to supplemental oxygen  Post-op Assessment: Post-op Vital signs reviewed, Patient's Cardiovascular Status Stable, Respiratory Function Stable, Patent Airway and No signs of Nausea or vomiting  Post-op Vital Signs: Reviewed and stable  Complications: No apparent anesthesia complications

## 2016-03-20 NOTE — Anesthesia Postprocedure Evaluation (Signed)
Anesthesia Post Note  Patient: Judith Barrett  Procedure(s) Performed: Procedure(s) (LRB): IMAGE GUIDED SINUS SURGERY (N/A) SEPTOPLASTY (N/A) TURBINATE REDUCTION (Bilateral) MAXILLARY ANTROSTOMY WITH TISSUE REMOVAL (Bilateral)  Patient location during evaluation: PACU Anesthesia Type: General Level of consciousness: awake and alert Pain management: pain level controlled Vital Signs Assessment: post-procedure vital signs reviewed and stable Respiratory status: spontaneous breathing, nonlabored ventilation, respiratory function stable and patient connected to nasal cannula oxygen Cardiovascular status: blood pressure returned to baseline and stable Postop Assessment: no signs of nausea or vomiting Anesthetic complications: no    Alta CorningBacon, Josclyn Rosales S

## 2016-03-20 NOTE — Op Note (Signed)
PREOPERATIVE DIAGNOSIS:  Chronic nasal obstruction.  POSTOPERATIVE DIAGNOSIS:  Chronic nasal obstruction.  SURGEON:  Davina Pokehapman Barrett. Rahma Meller, M.D.  NAME OF PROCEDURE:  1. Nasal septoplasty. 2. Submucous resection of inferior turbinates. 3. Use of Stryker navigation system 4. Bilateral endoscopic maxillary antrostomy with removal of tissue  OPERATIVE FINDINGS:  Severe nasal septal deformity, hypertrophy of the inferior turbinates.   DESCRIPTION OF THE PROCEDURE:  Judith Barrett was identified in the holding area and taken to the operating room and placed in the supine position.  After general endotracheal anesthesia was induced, the table was turned 45 degrees and the patient was placed in a semi-Fowler position.  The nose was then topically anesthetized with Lidocaine, cotton pledgets were placed within each nostril. After approximately 5 minutes, this was removed at which time a local anesthetic of 1% Lidocaine 1:100,000 units of Epinephrine was used to inject the inferior turbinates in the nasal septum. A total of 12 ml was used. Examination of the nose showed a left nasal septal deformity and tremendous hypertrophied inferior turbinate.  Beginning on the right hand side a hemitransfixion incision was then created on the leading edge of the septum on the right.  A subperichondrial plane was elevated posteriorly on the left and taken back to the perpendicular plate of the ethmoid where subperiosteal plane was elevated posteriorly on the left.  An inferior rim of cartilage was removed anteriorly with care taken to leave an anterior strut to prevent nasal collapse. With this strut removed the perpendicular plate of the ethmoid was separated from the quadrangular cartilage. The large septal spur was removed.  The septum was then replaced in the midline. Reinspection through each nostril showed excellent reduction of the septal deformity. A left posterior inferior fenestration was then created to allow  hematoma drainage.  with the septoplasty completed the operation then turned to the endoscopic sinus portion. Using the Stryker navigation her in the 0 endoscope the left nostril was examined. The middle turbinate was gently medialized the uncinate process was identified. The Cottle elevator was used to take down the uncinate process opening the maxillary sinus widely. Straight and side biting forceps were then used to open this antrostomy giving excellent opening to the sinus. In similar fashion the right internal turbinate was gently medialized again the uncinate process was taken down using the Cottle elevator straight and side biting forceps. This gave excellent opening to the right maxillary sinuses well. With this completed the operation then turned to the submucous section inferior turbinates.  Beginning on the left-hand side, a 15 blade was used to incise along the inferior edge of the inferior turbinate. A superior laterally based flap was then elevated. The underlying conchal bone of mucosa was excised using Knight scissors. The flap was then laid back over the turbinate stump and cauterized using suction cautery. In a similar fashion the submucous resection was performed on the right.  With the submucous resection completed bilaterally and no active bleeding, the hemitransfixion incision was then closed using two interrupted 3-0 chromic sutures.  Plastic nasal septal splints were placed within each nostril and affixed to the septum using a 3-0 nylon suture. Stammberger was then used beneath each inferior turbinate for hemostasis.    The patient tolerated the procedure well, was returned to anesthesia, extubated in the operating room, and taken to the recovery room in stable condition.    CULTURES:  None.  SPECIMENS:  maxillary sinus contents  ESTIMATED BLOOD LOSS:  25 cc.  Davina PokeMCQUEEN,Judith Barrett  03/20/2016  11:02 AM

## 2016-03-20 NOTE — Anesthesia Procedure Notes (Signed)
Procedure Name: Intubation Date/Time: 03/20/2016 10:20 AM Performed by: Jimmy PicketAMYOT, Judith Barrett Pre-anesthesia Checklist: Patient identified, Emergency Drugs available, Suction available, Patient being monitored and Timeout performed Patient Re-evaluated:Patient Re-evaluated prior to inductionOxygen Delivery Method: Circle system utilized Preoxygenation: Pre-oxygenation with 100% oxygen Intubation Type: IV induction Ventilation: Mask ventilation without difficulty Laryngoscope Size: Miller and 2 Grade View: Grade I Tube type: Oral Rae Tube size: 7.0 mm Number of attempts: 1 Placement Confirmation: ETT inserted through vocal cords under direct vision,  positive ETCO2 and breath sounds checked- equal and bilateral Tube secured with: Tape Dental Injury: Teeth and Oropharynx as per pre-operative assessment

## 2016-03-20 NOTE — H&P (Signed)
  H+P  Reviewed and will be scanned in later. No changes noted. 

## 2016-03-23 ENCOUNTER — Encounter: Payer: Self-pay | Admitting: Unknown Physician Specialty

## 2016-03-24 LAB — SURGICAL PATHOLOGY

## 2016-07-28 ENCOUNTER — Telehealth: Payer: Self-pay

## 2016-07-28 NOTE — Telephone Encounter (Signed)
Pt states she is taking fluoxetine and had originally been prescribed by Dr. Dear and now having trouble getting refills. Her neurologist gave a courtesy refill but wants it to be managed otherwise. Left msg for pt to schedule appt.

## 2016-07-30 NOTE — Telephone Encounter (Signed)
Left additional msg for pt to follow up and schedule appt for rx refill.

## 2016-08-21 NOTE — Telephone Encounter (Signed)
Pt received refill from another provider.

## 2016-09-22 DIAGNOSIS — M79673 Pain in unspecified foot: Secondary | ICD-10-CM | POA: Insufficient documentation

## 2016-09-22 DIAGNOSIS — S93429A Sprain of deltoid ligament of unspecified ankle, initial encounter: Secondary | ICD-10-CM | POA: Insufficient documentation

## 2016-09-22 DIAGNOSIS — M7670 Peroneal tendinitis, unspecified leg: Secondary | ICD-10-CM | POA: Insufficient documentation

## 2016-09-22 DIAGNOSIS — S93609A Unspecified sprain of unspecified foot, initial encounter: Secondary | ICD-10-CM | POA: Insufficient documentation

## 2016-10-01 DIAGNOSIS — M6281 Muscle weakness (generalized): Secondary | ICD-10-CM | POA: Insufficient documentation

## 2016-10-01 DIAGNOSIS — R269 Unspecified abnormalities of gait and mobility: Secondary | ICD-10-CM | POA: Insufficient documentation

## 2017-01-29 DIAGNOSIS — G43909 Migraine, unspecified, not intractable, without status migrainosus: Secondary | ICD-10-CM | POA: Insufficient documentation

## 2017-01-29 DIAGNOSIS — E559 Vitamin D deficiency, unspecified: Secondary | ICD-10-CM | POA: Insufficient documentation

## 2018-01-13 ENCOUNTER — Encounter: Payer: Self-pay | Admitting: Obstetrics and Gynecology

## 2018-07-25 ENCOUNTER — Telehealth: Payer: Self-pay

## 2018-07-25 NOTE — Telephone Encounter (Signed)
Pt calling for relief measures in the meantime - has appt Wed.  [phone]  Pt has a vaginal bump.  Has been using hot compresses and alternating between tylenol and ibuprofen.  Adv that was all I knew to tell her to do.  To continue.  Tylenol may do more than ibuprofen.  To take temp before each dose.

## 2018-07-27 ENCOUNTER — Other Ambulatory Visit (HOSPITAL_COMMUNITY)
Admission: RE | Admit: 2018-07-27 | Discharge: 2018-07-27 | Disposition: A | Discharge status: Home / Self Care | Payer: BC Managed Care – PPO | Source: Ambulatory Visit | Attending: Obstetrics and Gynecology | Admitting: Obstetrics and Gynecology

## 2018-07-27 ENCOUNTER — Encounter: Payer: Self-pay | Admitting: Obstetrics and Gynecology

## 2018-07-27 ENCOUNTER — Other Ambulatory Visit: Payer: Self-pay

## 2018-07-27 ENCOUNTER — Ambulatory Visit (INDEPENDENT_AMBULATORY_CARE_PROVIDER_SITE_OTHER): Payer: BC Managed Care – PPO | Admitting: Obstetrics and Gynecology

## 2018-07-27 VITALS — BP 110/82 | HR 78 | Ht 67.0 in | Wt 291.0 lb

## 2018-07-27 DIAGNOSIS — N750 Cyst of Bartholin's gland: Secondary | ICD-10-CM

## 2018-07-27 DIAGNOSIS — Z124 Encounter for screening for malignant neoplasm of cervix: Secondary | ICD-10-CM

## 2018-07-27 MED ORDER — HYDROCODONE-ACETAMINOPHEN 5-325 MG PO TABS: 1.0000 | ORAL_TABLET | Freq: Four times a day (QID) | ORAL | 0 refills | Status: DC | PRN

## 2018-07-27 MED ORDER — DOXYCYCLINE HYCLATE 100 MG PO CAPS: 100.0000 mg | ORAL_CAPSULE | Freq: Two times a day (BID) | ORAL | 0 refills | Status: DC

## 2018-07-27 MED ORDER — LIDOCAINE-PRILOCAINE 2.5-2.5 % EX CREA: 1.0000 "application " | TOPICAL_CREAM | CUTANEOUS | 0 refills | Status: DC | PRN

## 2018-07-27 NOTE — Progress Notes (Signed)
Obstetrics & Gynecology Office Visit   Chief Complaint:  Chief Complaint  Patient presents with   vaginal bump    thumb size lump outside vagina/oozing/painful xfew days    History of Present Illness: 48 year old female with a one week history of painful vulvar lump, located on the right just outside of the vagina.  Has not drained but has noted some increased clear discharge.  Has been using warm compress.  She has not had a similar lesion previously.  Does report some subjective chills no fevers.  Since presentation the lump.  She has not noted any other surrounding skin changes.  No history of abnormal pap smear, no associated abnormal bleeding.  Review of Systems: Review of Systems  Constitutional: Positive for chills. Negative for diaphoresis, fever, malaise/fatigue and weight loss.  Genitourinary: Negative.   Skin: Negative.    Past Medical History:  Past Medical History:  Diagnosis Date   Migraines    migraines, sinus, stress    Past Surgical History:  Past Surgical History:  Procedure Laterality Date   APPENDECTOMY     CESAREAN SECTION     IMAGE GUIDED SINUS SURGERY N/A 03/20/2016   Procedure: IMAGE GUIDED SINUS SURGERY;  Surgeon: Linus Salmons, MD;  Location: [facility];  Service: ENT;  Laterality: N/A;  STRYKER GAVE DISK TO CE[facility] 12-22 KP sent 2nd disk gave to cece 1-3 kp   LASIK     MAXILLARY ANTROSTOMY Bilateral 03/20/2016   Procedure: MAXILLARY ANTROSTOMY WITH TISSUE REMOVAL;  Surgeon: Linus Salmons, MD;  Location: [facility];  Service: ENT;  Laterality: Bilateral;   SEPTOPLASTY N/A 03/20/2016   Procedure: SEPTOPLASTY;  Surgeon: Linus Salmons, MD;  Location: [facility];  Service: ENT;  Laterality: N/A;   TONSILLECTOMY     TURBINATE REDUCTION Bilateral 03/20/2016   Procedure: TURBINATE REDUCTION;  Surgeon: Linus Salmons, MD;  Location: [facility];  Service: ENT;  Laterality: Bilateral;    Gynecologic History:  No LMP recorded.  Obstetric History: G2P2  Family History:  History reviewed. No pertinent family history.  Social History:  Social History   Socioeconomic History   Marital status: Married    Spouse name: Not on file   Number of children: Not on file   Years of education: Not on file   Highest education level: Not on file  Occupational History   Not on file  Social Needs   Financial resource strain: Not on file   Food insecurity:    Worry: Not on file    Inability: Not on file   Transportation needs:    Medical: Not on file    Non-medical: Not on file  Tobacco Use   Smoking status: Never Smoker   Smokeless tobacco: Never Used  Substance and Sexual Activity   Alcohol use: Yes    Comment: 1x/month   Drug use: Never   Sexual activity: Yes    Birth control/protection: None  Lifestyle   Physical activity:    Days per week: Not on file    Minutes per session: Not on file   Stress: Not on file  Relationships   Social connections:    Talks on phone: Not on file    Gets together: Not on file    Attends religious service: Not on file    Active member of club or organization: Not on file    Attends meetings of clubs or organizations: Not on file    Relationship status: Not on file  Intimate partner violence:    Fear of current or ex partner: Not on file    Emotionally abused: Not on file    Physically abused: Not on file    Forced sexual activity: Not on file  Other Topics Concern   Not on file  Social History Narrative   Not on file    Allergies:  No Known Allergies  Medications: Prior to Admission medications   Medication Sig Start Date End Date Taking? Authorizing Provider  acetaminophen (TYLENOL) 500 MG tablet Take 2 tablets (1,000 mg total) by mouth every 6 (six) hours as needed for moderate pain, fever or headache. 01/11/15  Yes Betancourt, Jarold Song, NP  almotriptan (AXERT) 12.5 MG tablet Take 12.5 mg by mouth as needed for migraine. may  repeat in 2 hours if needed   Yes [provider]  cetirizine (ZYRTEC) 10 MG tablet Take 10 mg by mouth daily.   Yes [provider]  Cholecalciferol (VITAMIN D-1000 MAX ST PO) Take by mouth daily.   Yes [provider]  cyclobenzaprine (FLEXERIL) 10 MG tablet Take 10 mg by mouth as needed (tension headaches).   Yes [provider]  FLUoxetine (PROZAC) 40 MG capsule Take 40 mg by mouth daily.   Yes [provider]  montelukast (SINGULAIR) 10 MG tablet Take 10 mg by mouth at bedtime.   Yes [provider]  sodium chloride (OCEAN) 0.65 % SOLN nasal spray Place 2 sprays into both nostrils every 2 (two) hours while awake. 01/11/15  Yes Betancourt, Jarold Song, NP  topiramate (TOPAMAX) 50 MG tablet Take 50 mg by mouth 2 (two) times daily.   Yes [provider]  doxycycline (VIBRAMYCIN) 100 MG capsule Take 1 capsule (100 mg total) by mouth 2 (two) times daily. 07/27/18   Vena Austria, MD  HYDROcodone-acetaminophen (NORCO/VICODIN) 5-325 MG tablet Take 1 tablet by mouth every 6 (six) hours as needed. 07/27/18   Vena Austria, MD  lidocaine-prilocaine (EMLA) cream Apply 1 application topically as needed. 07/27/18   Vena Austria, MD    Physical Exam Vitals:  Vitals:   07/27/18 1127  BP: 110/82  Pulse: 78   No LMP recorded.  General: NAD, well nourished, appears stated age HEENT: normocephalic, anicteric Pulmonary: No increased work of breathing Cardiovascular: RRR, distal pulses 2+ Abdomen: NABS, soft, non-tender, non-distended.  Umbilicus without lesions.  No hepatomegaly, splenomegaly or masses palpable. No evidence of hernia  Genitourinary:  External: Normal external female genitalia.  Normal urethral meatus, left Bartholin's gland with significant induration, no fluctuance appreciated, normal Skene's glands. The area overlying the area of induration was injected with 2cc of 1% lidocaine, needle aspiration did not reveal any  fluid collection   Vagina: Normal vaginal mucosa, no evidence of prolapse.    Cervix: Grossly normal in appearance, no bleeding  Uterus: Non-enlarged, mobile, normal contour.  No CMT  Adnexa: ovaries non-enlarged, no adnexal masses  Rectal: deferred  Lymphatic: no evidence of inguinal lymphadenopathy Extremities: no edema, erythema, or tenderness Neurologic: Grossly intact Psychiatric: mood appropriate, affect full  Female chaperone present for pelvic  portions of the physical exam  Assessment: 48 y.o. G2P2 with bartholin's gland cyst  Plan: Problem List Items Addressed This Visit    None    Visit Diagnoses    Screening for malignant neoplasm of cervix    -  Primary   Relevant Orders   Cytology - PAP   Bartholin gland cyst         1) Bartholin's gland -  enlargement and induration in the area of the right bartholin's gland noted on exam.  No clear defined area of fluctuance.  Needle aspiration did not yield evidence of a definable abscess cavity to drain.  Will start on 10 day course of doxycyline and re-examine at that time to see if any notable improvement.  Differential includes early bartholin's gland abscess, and less likley bartholins gland cancer, lymphoma.  We discussed that with antibiotic the area may regress but could also develop into a drainable fluid collection.  If remains stable with continued firmness and no fluid would proceed with MRI of pelvis, CBC with dif - pap collected  2) Obesity - patient interested in proceed with medical weight loss therapy but would defer until completion of work up for vulvar mass.   3) Return in about 10 days (around 08/06/2018).   Vena AustriaAndreas Carson Meche, MD, Merlinda FrederickFACOG [facility], [facility]

## 2018-07-27 NOTE — Patient Instructions (Signed)
Bartholin's Cyst or Abscess    A Bartholin's cyst is a fluid-filled sac that forms on a Bartholin's gland. Bartholin's glands are small glands in the folds of skin around the vaginal opening (labia). These glands produce a fluid to moisten (lubricate) the outside of the vagina during sex.  A cyst that is not large or infected may not cause any problems or require treatment. If the cyst gets infected, it is called a Bartholin's abscess. An abscess may cause symptoms such as pain and swelling and is more likely to require treatment.  What are the causes?  This condition may be caused by a blocked Bartholin's gland. These glands can become blocked due to natural buildup of fluid and oils. Bacteria inside of the cyst can cause infection.  In many cases, the cause is not known.  What increases the risk?  You may be at increased risk of developing a Bartholin's cyst or abscess if:  · You are of childbearing age.  · You have a history of Bartholin's cysts or abscesses.  · You have diabetes.  · You have an STI (sexually transmitted infection).  What are the signs or symptoms?  Symptoms may include:  · A bulge or lump on the labia, near the lower opening of the vagina.  · Discomfort or pain. This may get worse during sex or when walking.  · Redness, swelling, or fluid draining from the area. These may be signs of an abscess.  How severe your symptoms are depends on the size of your cyst and whether it is infected. Infection causes symptoms to get more severe.  How is this diagnosed?  This condition may be diagnosed based on:  · Your symptoms and medical history.  · A physical exam to check for swelling in your vaginal area. You may lie on your back on an exam table and have your feet placed into footrests for the exam.  · Blood tests to check for infections.  · Removal of a fluid sample from the cyst or abscess (biopsy) for testing.  You may work with a health care provider who specializes in women's health (gynecologist)  for diagnosis and treatment.  How is this treated?  If your cyst is small, not infected, and not causing symptoms, you may not need any treatment. These cysts often go away on their own, with home care such as hot baths or warm compresses.  If you have a large cyst or an abscess, treatment may include:  · Antibiotic medicine.  · A procedure to drain the fluid inside the cyst or abscess. These procedures involve making an incision in the cyst or abscess so that the fluid drains out, and then one of the following may be done:  ? A small, thin tube (catheter) may be placed inside the cyst or abscess so that it does not close and fill up with fluid again (fistulization). The catheter will be removed at a follow-up visit.  ? The edges of the incision may be stitched to your skin so that the cyst or abscess stays open (marsupialization). This allows it to continue to drain and not fill up with fluid again.  If you have cysts or abscesses that keep returning (recurring) and have required incision and drainage multiple times, your health care provider may talk with you about surgery to remove the Bartholin's gland.  Follow these instructions at home:  Medicines  · Take over-the-counter and prescription medicines only as told by your health care provider.  ·   If you were prescribed an antibiotic medicine, take it as told by your health care provider. Do not stop taking the antibiotic even if your condition improves.  Managing pain and swelling  · Try sitz baths to help with pain and swelling. A sitz bath is a warm water bath in which the water only comes up to your hips and should cover your buttocks. You may take sitz baths several times a day.  · Apply heat to the affected area as often as needed. Use the heat source that your health care provider recommends, such as a moist heat pack or a heating pad.  ? Place a towel between your skin and the heat source.  ? Leave the heat on for 20-30 minutes.  ? Remove the heat if your  skin turns bright red. This is especially important if you are unable to feel pain, heat, or cold. You may have a greater risk of getting burned.  General instructions  · If your cyst or abscess was drained, follow instructions from your health care provider about how to take care of your wound. Use feminine pads as needed to absorb any drainage.  · Do not push on or squeeze your cyst.  · Do not have sex until the cyst has gone away or your wound from drainage has healed.  · Take these steps to help prevent a Bartholin's cyst from returning, and to prevent other Bartholin's cysts from developing:  ? Take a bath or shower once a day. Clean your vaginal area with mild soap and water when you bathe.  ? Practice safe sex to prevent STIs. Talk with your health care provider about how to prevent STIs and which forms of birth control (contraception) may be best for you.  · Keep all follow-up visits as told by your health care provider. This is important.  Contact a health care provider if:  · You have a fever.  · You develop redness, swelling, or pain around your cyst.  · You have fluid, blood, pus, or a bad smell coming from your cyst.  · You have a cyst that gets larger or comes back.  Summary  · A Bartholin's cyst is a fluid-filled sac that forms on a Bartholin's gland. These glands are in the folds of skin around the vaginal opening (labia).  · If your cyst is small, not infected, and not causing symptoms, you may not need any treatment. These cysts often go away on their own, with home care such as hot baths or warm compresses.  · If you have a large cyst or an abscess, your health care provider may perform a procedure to drain the fluid.  · If you have cysts or abscesses that keep returning (recurring) and have required incision and drainage multiple times, your health care provider may talk with you about surgery to remove the Bartholin's gland.  This information is not intended to replace advice given to you by  your health care provider. Make sure you discuss any questions you have with your health care provider.  Document Released: 03/02/2005 Document Revised: 12/02/2016 Document Reviewed: 12/02/2016  Elsevier Interactive Patient Education © 2019 [employer]

## 2018-07-29 LAB — CYTOLOGY - PAP
Diagnosis: NEGATIVE
HPV: NOT DETECTED

## 2018-08-05 ENCOUNTER — Encounter: Payer: Self-pay | Admitting: Obstetrics and Gynecology

## 2018-08-05 ENCOUNTER — Other Ambulatory Visit: Payer: Self-pay

## 2018-08-05 ENCOUNTER — Ambulatory Visit (INDEPENDENT_AMBULATORY_CARE_PROVIDER_SITE_OTHER): Payer: BC Managed Care – PPO | Admitting: Obstetrics and Gynecology

## 2018-08-05 VITALS — BP 122/82 | Ht 67.0 in | Wt 297.0 lb

## 2018-08-05 DIAGNOSIS — Z6841 Body Mass Index (BMI) 40.0 and over, adult: Secondary | ICD-10-CM

## 2018-08-05 DIAGNOSIS — N750 Cyst of Bartholin's gland: Secondary | ICD-10-CM

## 2018-08-05 DIAGNOSIS — N907 Vulvar cyst: Secondary | ICD-10-CM | POA: Diagnosis not present

## 2018-08-05 MED ORDER — PHENTERMINE HCL 37.5 MG PO TABS: 37.5000 mg | ORAL_TABLET | Freq: Every day | ORAL | 0 refills | Status: DC

## 2018-08-05 MED ORDER — DOXYCYCLINE HYCLATE 100 MG PO CAPS: 100.0000 mg | ORAL_CAPSULE | Freq: Two times a day (BID) | ORAL | 0 refills | Status: AC

## 2018-08-05 NOTE — Progress Notes (Signed)
Obstetrics & Gynecology Office Visit   Chief Complaint:  Chief Complaint  Patient presents with  . Follow-up    Vaginal cyst    History of Present Illness: 48 y.o. G2P2 presenting for medication follow up for a diagnosis of bartholin's abscess.  She is currently being managed with antibiotics.   The patient reports improvement in symptoms but continued small area of induration and firmness.  On her current medication regimen.   She has not noted any side-effects or new symptoms.     Patientis a 48 y.o. G2P2 female, who presents for the evaluation of weight gain. She has been unable to achieve weight loss unassisted.  he patient states the following issues have contributed to her weight problem: lifestyle  The patient has no additional symptoms. The patient specifically denies memory loss, muscle weakness, excessive thirst, and polyuria.She has tried phentermine interventions in the past with good success.   Review of Systems: Review of Systems  Constitutional: Negative.   Gastrointestinal: Negative.   Genitourinary: Negative.      Past Medical History:  Past Medical History:  Diagnosis Date  . Migraines    migraines, sinus, stress    Past Surgical History:  Past Surgical History:  Procedure Laterality Date  . APPENDECTOMY    . CESAREAN SECTION    . IMAGE GUIDED SINUS SURGERY N/A 03/20/2016   Procedure: IMAGE GUIDED SINUS SURGERY;  Surgeon: Linus Salmons, MD;  Location: [facility];  Service: ENT;  Laterality: N/A;  STRYKER GAVE DISK TO [facility] 12-22 KP sent 2nd disk gave to cece 1-3 kp  . LASIK    . MAXILLARY ANTROSTOMY Bilateral 03/20/2016   Procedure: MAXILLARY ANTROSTOMY WITH TISSUE REMOVAL;  Surgeon: Linus Salmons, MD;  Location: [facility];  Service: ENT;  Laterality: Bilateral;  . SEPTOPLASTY N/A 03/20/2016   Procedure: SEPTOPLASTY;  Surgeon: Linus Salmons, MD;  Location: [facility];  Service: ENT;  Laterality: N/A;  . TONSILLECTOMY     . TURBINATE REDUCTION Bilateral 03/20/2016   Procedure: TURBINATE REDUCTION;  Surgeon: Linus Salmons, MD;  Location: [facility];  Service: ENT;  Laterality: Bilateral;    Gynecologic History: No LMP recorded.  Obstetric History: G2P2  Family History:  History reviewed. No pertinent family history.  Social History:  Social History   Socioeconomic History  . Marital status: Married    Spouse name: Not on file  . Number of children: Not on file  . Years of education: Not on file  . Highest education level: Not on file  Occupational History  . Not on file  Social Needs  . Financial resource strain: Not on file  . Food insecurity:    Worry: Not on file    Inability: Not on file  . Transportation needs:    Medical: Not on file    Non-medical: Not on file  Tobacco Use  . Smoking status: Never Smoker  . Smokeless tobacco: Never Used  Substance and Sexual Activity  . Alcohol use: Yes    Comment: 1x/month  . Drug use: Never  . Sexual activity: Yes    Birth control/protection: None  Lifestyle  . Physical activity:    Days per week: Not on file    Minutes per session: Not on file  . Stress: Not on file  Relationships  . Social connections:    Talks on phone: Not on file    Gets together: Not on file    Attends religious service: Not on file  Active member of club or organization: Not on file    Attends meetings of clubs or organizations: Not on file    Relationship status: Not on file  . Intimate partner violence:    Fear of current or ex partner: Not on file    Emotionally abused: Not on file    Physically abused: Not on file    Forced sexual activity: Not on file  Other Topics Concern  . Not on file  Social History Narrative  . Not on file    Allergies:  No Known Allergies  Medications: Prior to Admission medications   Medication Sig Start Date End Date Taking? Authorizing Provider  acetaminophen (TYLENOL) 500 MG tablet Take 2 tablets (1,000 mg  total) by mouth every 6 (six) hours as needed for moderate pain, fever or headache. 01/11/15  Yes Betancourt, Jarold Song, NP  almotriptan (AXERT) 12.5 MG tablet Take 12.5 mg by mouth as needed for migraine. may repeat in 2 hours if needed   Yes [provider]  cetirizine (ZYRTEC) 10 MG tablet Take 10 mg by mouth daily.   Yes [provider]  Cholecalciferol (VITAMIN D-1000 MAX ST PO) Take by mouth daily.   Yes [provider]  cyclobenzaprine (FLEXERIL) 10 MG tablet Take 10 mg by mouth as needed (tension headaches).   Yes [provider]  doxycycline (VIBRAMYCIN) 100 MG capsule Take 1 capsule (100 mg total) by mouth 2 (two) times daily. 07/27/18  Yes Vena Austria, MD  FLUoxetine (PROZAC) 40 MG capsule Take 40 mg by mouth daily.   Yes [provider]  HYDROcodone-acetaminophen (NORCO/VICODIN) 5-325 MG tablet Take 1 tablet by mouth every 6 (six) hours as needed. 07/27/18  Yes Vena Austria, MD  lidocaine-prilocaine (EMLA) cream Apply 1 application topically as needed. 07/27/18  Yes Vena Austria, MD  montelukast (SINGULAIR) 10 MG tablet Take 10 mg by mouth at bedtime.   Yes [provider]  sodium chloride (OCEAN) 0.65 % SOLN nasal spray Place 2 sprays into both nostrils every 2 (two) hours while awake. 01/11/15  Yes Betancourt, Jarold Song, NP  topiramate (TOPAMAX) 50 MG tablet Take 50 mg by mouth 2 (two) times daily.   Yes [provider]    Physical Exam Blood pressure 122/82, height  (1.702 m), weight 297 lb (134.7 kg). Body mass index is 46.52 kg/m.  General: NAD HEENT: normocephalic, anicteric Thyroid: no enlargement, no palpable nodules Pulmonary: No increased work of breathing Genitourinary:  External: Normal external female genitalia.  Normal urethral meatus, normal  Bartholin's and Skene's glands.  Significant improvement in induration but still small firm 1cm nodule posterior left introitus  Vagina: Normal vaginal  mucosa, no evidence of prolapse.    Cervix: Grossly normal in appearance, no bleeding  Uterus: Non-enlarged, mobile, normal contour.  No CMT  Adnexa: ovaries non-enlarged, no adnexal masses  Rectal: deferred  Lymphatic: no evidence of inguinal lymphadenopathy Extremities: no edema, erythema, or tenderness Neurologic: Grossly intact Psychiatric: mood appropriate, affect full  Female chaperone present for pelvic  portions of the physical exam  Assessment: 48 y.o. G2P2 with bartholin's gland/vulvar mass, medical weight loss   Plan: Problem List Items Addressed This Visit    None    Visit Diagnoses    Class 3 severe obesity without serious comorbidity with body mass index (BMI) of 45.0 to 49.9 in adult, unspecified obesity type (HCC)    -  Primary   Relevant Medications   phentermine (ADIPEX-P) 37.5 MG tablet  Other Relevant Orders   TSH   CBC   Lipid panel   Hemoglobin A1c   Bartholin gland cyst       Relevant Orders   CBC   Vulvar cyst       Relevant Orders   MR MRA PELVIS W WO CONTRAST     Bartholin's Gland  1) Some improvement on initial 10 day course of doxycline, extend for additional 7 days  2) Given suboptimal response and continued firmness in area will obtain pelvic MRI to rule out other infiltrative lesion such as lymphoma.    3) CBC  Obesity  1) 1500 Calorie ADA Diet  2) Patient education given regarding appropriate lifestyle changes for weight loss including: regular physical activity, healthy coping strategies, caloric restriction and healthy eating patterns.  3) Patient will be started on weight loss medication. The risks and benefits and side effects of medication, such as Adipex (Phenteramine) ,  Tenuate (Diethylproprion), Belviq (lorcarsin), Contrave (buproprion/naltrexone), Qsymia (phentermine/topiramate), and Saxenda (liraglutide) is discussed. The pros and cons of suppressing appetite and boosting metabolism is discussed. Risks of tolerence and  addiction is discussed for selected agents discussed. Use of medicine will ne short term, such as 3-4 months at a time followed by a period of time off of the medicine to avoid these risks and side effects for Adipex, Qsymia, and Tenuate discussed. Pt to call with any negative side effects and agrees to keep follow up appts. - will start on phentermine  4) Comorbidity Screening - hypothyroidism screening, diabetes, and hyperlipidemia screening offered  5) Encouraged weekly weight monitorig to track progress and sample 1 week food diary  6) 15 minutes face-to-face; counseling/coordination of care > 50 percent of visit  7) Follow up in 4 weeks to assess response  Vena AustriaAndreas Andjela Wickes, MD, Merlinda FrederickFACOG [facility], [facility] 08/05/2018, 10:37 PM

## 2018-08-06 LAB — LIPID PANEL
Chol/HDL Ratio: 6.4 ratio — ABNORMAL HIGH (ref 0.0–4.4)
Cholesterol, Total: 167 mg/dL (ref 100–199)
HDL: 26 mg/dL — ABNORMAL LOW (ref >39)
LDL Calculated: 68 mg/dL (ref 0–99)
Triglycerides: 364 mg/dL — ABNORMAL HIGH (ref 0–149)
VLDL Cholesterol Cal: 73 mg/dL — ABNORMAL HIGH (ref 5–40)

## 2018-08-06 LAB — CBC
Hematocrit: 39.7 % (ref 34.0–46.6)
Hemoglobin: 13.8 g/dL (ref 11.1–15.9)
MCH: 29.1 pg (ref 26.6–33.0)
MCHC: 34.8 g/dL (ref 31.5–35.7)
MCV: 84 fL (ref 79–97)
Platelets: 388 10*3/uL (ref 150–450)
RBC: 4.74 x10E6/uL (ref 3.77–5.28)
RDW: 12.6 % (ref 11.7–15.4)
WBC: 12.6 10*3/uL — ABNORMAL HIGH (ref 3.4–10.8)

## 2018-08-06 LAB — HEMOGLOBIN A1C
Est. average glucose Bld gHb Est-mCnc: 123 mg/dL
Hgb A1c MFr Bld: 5.9 % — ABNORMAL HIGH (ref 4.8–5.6)

## 2018-08-06 LAB — TSH: TSH: 2.82 u[IU]/mL (ref 0.450–4.500)

## 2018-08-10 ENCOUNTER — Other Ambulatory Visit: Payer: Self-pay | Admitting: Obstetrics and Gynecology

## 2018-08-10 DIAGNOSIS — N907 Vulvar cyst: Secondary | ICD-10-CM

## 2018-08-11 ENCOUNTER — Other Ambulatory Visit: Payer: Self-pay | Admitting: Obstetrics and Gynecology

## 2018-08-11 ENCOUNTER — Telehealth: Payer: Self-pay | Admitting: Obstetrics and Gynecology

## 2018-08-11 DIAGNOSIS — N907 Vulvar cyst: Secondary | ICD-10-CM

## 2018-08-11 NOTE — Telephone Encounter (Signed)
Patient schedule at [facility] on 08/20/18 Arrival 7:30. Left voicemail for patient to call back to confirm.

## 2018-08-20 ENCOUNTER — Ambulatory Visit: Payer: BC Managed Care – PPO

## 2018-08-23 ENCOUNTER — Telehealth: Payer: Self-pay

## 2018-08-23 NOTE — Telephone Encounter (Signed)
Pt aware.

## 2018-08-23 NOTE — Telephone Encounter (Signed)
LVM for patient to call me back 

## 2018-08-23 NOTE — Telephone Encounter (Signed)
I think I put it in as anxiety for sedation so they should be able to give her something through the IV once she gets there

## 2018-08-23 NOTE — Telephone Encounter (Signed)
Pt calling stating she is scheduled for an MRI tomorrow. When she was here AMS told her he would call her in a medication  Because she is clostaphobic (sp?). CB# [phone]  AMS please let me know when it has been called in so I can make her aware. Thank you

## 2018-08-24 ENCOUNTER — Other Ambulatory Visit: Payer: Self-pay | Admitting: Obstetrics and Gynecology

## 2018-08-24 MED ORDER — DIAZEPAM 5 MG PO TABS: 5.0000 mg | ORAL_TABLET | Freq: Once | ORAL | 0 refills | Status: DC

## 2018-08-24 MED ORDER — DIAZEPAM 5 MG PO TABS: 5.0000 mg | ORAL_TABLET | Freq: Once | ORAL | 0 refills | Status: AC

## 2018-08-24 NOTE — Telephone Encounter (Signed)
Pt called to let us know she called the hospital and they said the medication needed to be oral.  Can AMS call something in?  [phone]

## 2018-08-24 NOTE — Telephone Encounter (Signed)
Please advise 

## 2018-08-25 ENCOUNTER — Other Ambulatory Visit: Payer: Self-pay

## 2018-08-25 ENCOUNTER — Ambulatory Visit
Admission: RE | Admit: 2018-08-25 | Discharge: 2018-08-25 | Disposition: A | Discharge status: Home / Self Care | Payer: BC Managed Care – PPO | Source: Ambulatory Visit | Attending: Obstetrics and Gynecology | Admitting: Obstetrics and Gynecology

## 2018-08-25 DIAGNOSIS — N907 Vulvar cyst: Secondary | ICD-10-CM | POA: Insufficient documentation

## 2018-08-25 MED ORDER — GADOBUTROL 1 MMOL/ML IV SOLN
10.0000 mL | Freq: Once | INTRAVENOUS | Status: AC | PRN
  Administered 2018-08-25: 10 mL via INTRAVENOUS

## 2018-09-03 NOTE — Progress Notes (Signed)
Gynecology Office Visit  Chief Complaint:  Chief Complaint  Patient presents with  . Follow-up    Cyst is better    History of Present Illness: Patientis a 48 y.o. [place] female, who presents for the evaluation of the desire to lose weight. She has lost 6 pounds 1 months. The patient states the following symptoms since starting her weight loss therapy: appetite suppression, energy, and weight loss.  The patient also reports no other ill effects. The patient specifically denies heart palpitations, anxiety, and insomnia.    Review of Systems: 10 point review of systems negative unless otherwise noted in HPI  Past Medical History:  Past Medical History:  Diagnosis Date  . Bartholin cyst   . Migraines    migraines, sinus, stress    Past Surgical History:  Past Surgical History:  Procedure Laterality Date  . APPENDECTOMY    . CESAREAN SECTION    . IMAGE GUIDED SINUS SURGERY N/A 03/20/2016   Procedure: IMAGE GUIDED SINUS SURGERY;  Surgeon: Beverly Gust, MD;  Location: [place];  Service: ENT;  Laterality: N/A;  STRYKER GAVE DISK TO [facility] 12-22 KP sent 2nd disk gave to cece 1-3 kp  . LASIK    . MAXILLARY ANTROSTOMY Bilateral 03/20/2016   Procedure: MAXILLARY ANTROSTOMY WITH TISSUE REMOVAL;  Surgeon: Beverly Gust, MD;  Location: [place];  Service: ENT;  Laterality: Bilateral;  . SEPTOPLASTY N/A 03/20/2016   Procedure: SEPTOPLASTY;  Surgeon: Beverly Gust, MD;  Location: [place];  Service: ENT;  Laterality: N/A;  . TONSILLECTOMY    . TURBINATE REDUCTION Bilateral 03/20/2016   Procedure: TURBINATE REDUCTION;  Surgeon: Beverly Gust, MD;  Location: [place];  Service: ENT;  Laterality: Bilateral;    Gynecologic History: No LMP recorded. (Menstrual status: Irregular Periods).  Obstetric History: G2P2  Family History:  History reviewed. No pertinent family history.  Social History:  Social History   Socioeconomic History  .  Marital status: Married    Spouse name: Not on file  . Number of children: Not on file  . Years of education: Not on file  . Highest education level: Not on file  Occupational History  . Not on file  Social Needs  . Financial resource strain: Not on file  . Food insecurity    Worry: Not on file    Inability: Not on file  . Transportation needs    Medical: Not on file    Non-medical: Not on file  Tobacco Use  . Smoking status: Never Smoker  . Smokeless tobacco: Never Used  Substance and Sexual Activity  . Alcohol use: Yes    Comment: 1x/month  . Drug use: Never  . Sexual activity: Yes    Birth control/protection: None  Lifestyle  . Physical activity    Days per week: Not on file    Minutes per session: Not on file  . Stress: Not on file  Relationships  . Social Herbalist on phone: Not on file    Gets together: Not on file    Attends religious service: Not on file    Active member of club or organization: Not on file    Attends meetings of clubs or organizations: Not on file    Relationship status: Not on file  . Intimate partner violence    Fear of current or ex partner: Not on file    Emotionally abused: Not on file    Physically abused: Not on file  Forced sexual activity: Not on file  Other Topics Concern  . Not on file  Social History Narrative  . Not on file    Allergies:  No Known Allergies  Medications: Prior to Admission medications   Medication Sig Start Date End Date Taking? Authorizing Provider  acetaminophen (TYLENOL) 500 MG tablet Take 2 tablets (1,000 mg total) by mouth every 6 (six) hours as needed for moderate pain, fever or headache. 01/11/15   Betancourt, Jarold Songina A, NP  almotriptan (AXERT) 12.5 MG tablet Take 12.5 mg by mouth as needed for migraine. may repeat in 2 hours if needed    [provider]  cetirizine (ZYRTEC) 10 MG tablet Take 10 mg by mouth daily.    [provider]  Cholecalciferol (VITAMIN D-1000 MAX  ST PO) Take by mouth daily.    [provider]  cyclobenzaprine (FLEXERIL) 10 MG tablet Take 10 mg by mouth as needed (tension headaches).    [provider]  doxycycline (VIBRAMYCIN) 100 MG capsule Take 1 capsule (100 mg total) by mouth 2 (two) times daily. 07/27/18   Vena AustriaStaebler, Carrigan Delafuente, MD  FLUoxetine (PROZAC) 40 MG capsule Take 40 mg by mouth daily.    [provider]  HYDROcodone-acetaminophen (NORCO/VICODIN) 5-325 MG tablet Take 1 tablet by mouth every 6 (six) hours as needed. 07/27/18   Vena AustriaStaebler, Nygeria Lager, MD  lidocaine-prilocaine (EMLA) cream Apply 1 application topically as needed. 07/27/18   Vena AustriaStaebler, Katelynd Blauvelt, MD  montelukast (SINGULAIR) 10 MG tablet Take 10 mg by mouth at bedtime.    [provider]  phentermine (ADIPEX-P) 37.5 MG tablet Take 1 tablet (37.5 mg total) by mouth daily before breakfast. 08/05/18   Vena AustriaStaebler, Amirrah Quigley, MD  sodium chloride (OCEAN) 0.65 % SOLN nasal spray Place 2 sprays into both nostrils every 2 (two) hours while awake. 01/11/15   Betancourt, Jarold Songina A, NP  topiramate (TOPAMAX) 50 MG tablet Take 50 mg by mouth 2 (two) times daily.    [provider]    Physical Exam Blood pressure 132/84, pulse 84, height 5\' 7"  (1.702 m), weight 291 lb (132 kg). Body mass index is 45.58 kg/m.  Wt Readings from Last 3 Encounters:  09/05/18 291 lb (132 kg)  08/05/18 297 lb (134.7 kg)  07/27/18 291 lb (132 kg)    General: NAD HEENT: normocephalic, anicteric Thyroid: no enlargement Pulmonary: no increased work of breathing Neurologic: Grossly intact Psychiatric: mood appropriate, affect full  Assessment: 48 y.o. G2P2 medical weight loss follow up  Plan: Problem List Items Addressed This Visit    None    Visit Diagnoses    Class 3 severe obesity without serious comorbidity with body mass index (BMI) of 45.0 to 49.9 in adult, unspecified obesity type (HCC)    -  Primary      1) 1500 Calorie ADA Diet  2) Patient education given  regarding appropriate lifestyle changes for weight loss including: regular physical activity, healthy coping strategies, caloric restriction and healthy eating patterns.  3) Patient will be started on weight loss medication. The risks and benefits and side effects of medication, such as Adipex (Phenteramine) ,  Tenuate (Diethylproprion), Belviq (lorcarsin), Contrave (buproprion/naltrexone), Qsymia (phentermine/topiramate), and Saxenda (liraglutide) is discussed. The pros and cons of suppressing appetite and boosting metabolism is discussed. Risks of tolerence and addiction is discussed for selected agents discussed. Use of medicine will ne short term, such as 3-4 months at a time followed by a period of time off of the medicine to avoid these risks and side  effects for Adipex, Qsymia, and Tenuate discussed. Pt to call with any negative side effects and agrees to keep follow up appts.  4) Patient to take medication, with the benefits of appetite suppression and metabolism boost d/w pt, along with the side effects and risk factors of long term use that will be avoided with our use of short bursts of therapy. Rx provided.    5) 15 minutes face-to-face; with counseling/coordination of care > 50 percent of visit related to obesity and ongoing management/treatment   6)  Return in about 4 weeks (around 10/03/2018) for medication follow up.    Vena AustriaAndreas Shell Yandow, MD, Evern CoreFACOG [facility], [facility] 09/05/2018, 8:20 AM

## 2018-09-05 ENCOUNTER — Other Ambulatory Visit: Payer: Self-pay

## 2018-09-05 ENCOUNTER — Encounter: Payer: Self-pay | Admitting: Obstetrics and Gynecology

## 2018-09-05 ENCOUNTER — Ambulatory Visit: Payer: BC Managed Care – PPO | Admitting: Obstetrics and Gynecology

## 2018-09-05 DIAGNOSIS — Z6841 Body Mass Index (BMI) 40.0 and over, adult: Secondary | ICD-10-CM | POA: Diagnosis not present

## 2018-09-05 MED ORDER — PHENTERMINE HCL 37.5 MG PO TABS: 37.5000 mg | ORAL_TABLET | Freq: Every day | ORAL | 0 refills | Status: DC

## 2018-09-05 NOTE — Patient Instructions (Signed)
NOOM is a weight loss app   FAST FACTS . Body Mass Index (BMI) is one measurement that your doctor may use to discuss your weight . BMI is an estimate of body fat. Individuals with a BMI of 25.0-29.9 are considered overweight. Those with a BMI above 30.0 are considered obese . Obesity is a risk factor for many cancers, especially endometrial cancer. In fact, if you are obese, your risk for endometrial cancer may be 10 times higher. . Obesity may affect how your cancer is treated (surgery, chemotherapy, and/or radiation). . If you are overweight or obese, ask your doctor for information about diet and exercise programs.  EXERCISE Here is a list of resources for exercise recommendations and programs that can help you get started. Be sure to look for exercise programs and classes in your neighborhood to get personal support.  American Cancer Society (ACS): Eat Healthy and Get Active www.cancer.org/healthy/eathealthygetactive/ The site provides details about the importance of exercise in cancer prevention as well as resources providing exercise guidelines and tools to set goals and manage physical activity  American Council on Exercise (ACE): Get Fit www.acefitness.org/acefit  This site is full of fitness programs including personalized training workouts and a Art therapist of exercise programs. Links to local exercise trainers are provided.  American Heart Association: Getting Healthy - Physical Activity https://richard.com/ This site provides the American Heart Association guidelines for physical activity, tips for getting started and tips for long term success.

## 2018-10-04 ENCOUNTER — Encounter: Payer: Self-pay | Admitting: Obstetrics and Gynecology

## 2018-10-04 ENCOUNTER — Other Ambulatory Visit: Payer: Self-pay

## 2018-10-04 ENCOUNTER — Ambulatory Visit (INDEPENDENT_AMBULATORY_CARE_PROVIDER_SITE_OTHER): Payer: BC Managed Care – PPO | Admitting: Obstetrics and Gynecology

## 2018-10-04 VITALS — BP 132/84 | HR 100 | Ht 67.0 in | Wt 289.0 lb

## 2018-10-04 DIAGNOSIS — Z6841 Body Mass Index (BMI) 40.0 and over, adult: Secondary | ICD-10-CM | POA: Diagnosis not present

## 2018-10-04 MED ORDER — NOVOFINE 32G X 6 MM MISC: 1.0000 [IU] | Freq: Every day | 3 refills | Status: DC

## 2018-10-04 MED ORDER — SAXENDA 18 MG/3ML ~~LOC~~ SOPN: 3.0000 mg | PEN_INJECTOR | Freq: Every day | SUBCUTANEOUS | 2 refills | Status: DC

## 2018-10-04 NOTE — Progress Notes (Signed)
Gynecology Office Visit  Chief Complaint:  Chief Complaint  Patient presents with  . Weight Check    History of Present Illness: Patientis a 48 y.o. 172P2002 female, who presents for the evaluation of the desire to lose weight. She has lost 2 pounds 1 months, 8lbs total weight loss in 2 months. The patient states the following symptoms since starting her weight loss therapy: appetite suppression, energy, and weight loss.  The patient also reports no other ill effects. The patient specifically denies heart palpitations, anxiety, and insomnia.    Review of Systems: 10 point review of systems negative unless otherwise noted in HPI  Past Medical History:  Past Medical History:  Diagnosis Date  . Bartholin cyst   . Migraines    migraines, sinus, stress    Past Surgical History:  Past Surgical History:  Procedure Laterality Date  . APPENDECTOMY    . CESAREAN SECTION    . IMAGE GUIDED SINUS SURGERY N/A 03/20/2016   Procedure: IMAGE GUIDED SINUS SURGERY;  Surgeon: Linus Salmonshapman McQueen, MD;  [facility];  Service: ENT;  Laterality: N/A;  STRYKER GAVE DISK TO [facility] 12-22 KP sent 2nd disk gave to cece 1-3 kp  . LASIK    . MAXILLARY ANTROSTOMY Bilateral 03/20/2016   Procedure: MAXILLARY ANTROSTOMY WITH TISSUE REMOVAL;  Surgeon: Linus Salmonshapman McQueen, MD;  [facility];  Service: ENT;  Laterality: Bilateral;  . SEPTOPLASTY N/A 03/20/2016   Procedure: SEPTOPLASTY;  Surgeon: Linus Salmonshapman McQueen, MD;  [facility];  Service: ENT;  Laterality: N/A;  . TONSILLECTOMY    . TURBINATE REDUCTION Bilateral 03/20/2016   Procedure: TURBINATE REDUCTION;  Surgeon: Linus Salmonshapman McQueen, MD;  [facility];  Service: ENT;  Laterality: Bilateral;    Gynecologic History: No LMP recorded. (Menstrual status: Irregular Periods).  Obstetric History: [id number]  Family History:  History reviewed. No pertinent family history.  Social History:  Social History    Socioeconomic History  . Marital status: Married    Spouse name: Not on file  . Number of children: Not on file  . Years of education: Not on file  . Highest education level: Not on file  Occupational History  . Not on file  Social Needs  . Financial resource strain: Not on file  . Food insecurity    Worry: Not on file    Inability: Not on file  . Transportation needs    Medical: Not on file    Non-medical: Not on file  Tobacco Use  . Smoking status: Never Smoker  . Smokeless tobacco: Never Used  Substance and Sexual Activity  . Alcohol use: Yes    Comment: 1x/month  . Drug use: Never  . Sexual activity: Yes    Birth control/protection: None  Lifestyle  . Physical activity    Days per week: Not on file    Minutes per session: Not on file  . Stress: Not on file  Relationships  . Social Musicianconnections    Talks on phone: Not on file    Gets together: Not on file    Attends religious service: Not on file    Active member of club or organization: Not on file    Attends meetings of clubs or organizations: Not on file    Relationship status: Not on file  . Intimate partner violence    Fear of current or ex partner: Not on file    Emotionally abused: Not on file    Physically abused: Not  on file    Forced sexual activity: Not on file  Other Topics Concern  . Not on file  Social History Narrative  . Not on file    Allergies:  No Known Allergies  Medications: Prior to Admission medications   Medication Sig Start Date End Date Taking? Authorizing Provider  acetaminophen (TYLENOL) 500 MG tablet Take 2 tablets (1,000 mg total) by mouth every 6 (six) hours as needed for moderate pain, fever or headache. 01/11/15  Yes Betancourt, Jarold Songina A, NP  almotriptan (AXERT) 12.5 MG tablet Take 12.5 mg by mouth as needed for migraine. may repeat in 2 hours if needed   Yes [provider]  cetirizine (ZYRTEC) 10 MG tablet Take 10 mg by mouth daily.   Yes [provider]   Cholecalciferol (VITAMIN D-1000 MAX ST PO) Take by mouth daily.   Yes [provider]  cyclobenzaprine (FLEXERIL) 10 MG tablet Take 10 mg by mouth as needed (tension headaches).   Yes [provider]  FLUoxetine (PROZAC) 40 MG capsule Take 40 mg by mouth daily.   Yes [provider]  fluticasone (FLONASE) 50 MCG/ACT nasal spray fluticasone propionate 50 mcg/actuation nasal spray,suspension   Yes [provider]  ibuprofen (ADVIL) 200 MG tablet Take by mouth.   Yes [provider]  phentermine (ADIPEX-P) 37.5 MG tablet Take 1 tablet (37.5 mg total) by mouth daily before breakfast. 09/05/18  Yes Vena AustriaStaebler, Adel Neyer, MD  promethazine (PHENERGAN) 12.5 MG tablet Take by mouth. 12/18/16  Yes [provider]  topiramate (TOPAMAX) 50 MG tablet Take 50 mg by mouth 2 (two) times daily.   Yes [provider]    Physical Exam Blood pressure 132/84, pulse 100, height 5\' 7"  (1.702 m), weight 289 lb (131.1 kg). Wt Readings from Last 3 Encounters:  10/04/18 289 lb (131.1 kg)  09/05/18 291 lb (132 kg)  08/05/18 297 lb (134.7 kg)  Body mass index is 45.26 kg/m.  General: NAD HEENT: normocephalic, anicteric Thyroid: no enlargement Pulmonary: no increased work of breathing Neurologic: Grossly intact Psychiatric: mood appropriate, affect full  Assessment: 48 y.o. [id number] medical weight loss follow  Plan: Problem List Items Addressed This Visit    None    Visit Diagnoses    Class 3 severe obesity without serious comorbidity with body mass index (BMI) of 45.0 to 49.9 in adult, unspecified obesity type (HCC)    -  Primary   Relevant Medications   Liraglutide -Weight Management (SAXENDA) 18 MG/3ML SOPN      1) 1500 Calorie ADA Diet  2) Patient education given regarding appropriate lifestyle changes for weight loss including: regular physical activity, healthy coping strategies, caloric restriction and healthy eating patterns.  3) Patient  will be started on weight loss medication. The risks and benefits and side effects of medication, such as Adipex (Phenteramine) ,  Tenuate (Diethylproprion), Belviq (lorcarsin), Contrave (buproprion/naltrexone), Qsymia (phentermine/topiramate), and Saxenda (liraglutide) is discussed. The pros and cons of suppressing appetite and boosting metabolism is discussed. Risks of tolerence and addiction is discussed for selected agents discussed. Use of medicine will ne short term, such as 3-4 months at a time followed by a period of time off of the medicine to avoid these risks and side effects for Adipex, Qsymia, and Tenuate discussed. Pt to call with any negative side effects and agrees to keep follow up appts. - switch to saxenda  4) Patient to take medication, with the benefits of appetite suppression and metabolism boost d/w pt, along with the  side effects and risk factors of long term use that will be avoided with our use of short bursts of therapy. Rx provided.    5) 15 minutes face-to-face; with counseling/coordination of care > 50 percent of visit related to obesity and ongoing management/treatment   6)  Return in about 3 months (around 01/04/2019) for medication follow up.    Malachy Mood, MD, [facility], [facility] 10/04/2018, 8:13 AM

## 2019-01-10 ENCOUNTER — Encounter: Payer: Self-pay | Admitting: Obstetrics and Gynecology

## 2019-01-10 ENCOUNTER — Encounter: Payer: Self-pay | Admitting: Gastroenterology

## 2019-01-10 ENCOUNTER — Other Ambulatory Visit: Payer: Self-pay

## 2019-01-10 ENCOUNTER — Ambulatory Visit: Payer: BC Managed Care – PPO | Admitting: Gastroenterology

## 2019-01-10 ENCOUNTER — Ambulatory Visit (INDEPENDENT_AMBULATORY_CARE_PROVIDER_SITE_OTHER): Payer: BC Managed Care – PPO | Admitting: Obstetrics and Gynecology

## 2019-01-10 VITALS — BP 131/84 | HR 65 | Temp 97.4°F | Ht 67.0 in | Wt 288.8 lb

## 2019-01-10 VITALS — BP 112/80 | HR 75 | Ht 67.0 in | Wt 285.0 lb

## 2019-01-10 DIAGNOSIS — Z6841 Body Mass Index (BMI) 40.0 and over, adult: Secondary | ICD-10-CM | POA: Diagnosis not present

## 2019-01-10 DIAGNOSIS — F458 Other somatoform disorders: Secondary | ICD-10-CM

## 2019-01-10 DIAGNOSIS — K219 Gastro-esophageal reflux disease without esophagitis: Secondary | ICD-10-CM

## 2019-01-10 MED ORDER — SAXENDA 18 MG/3ML ~~LOC~~ SOPN: 3.0000 mg | PEN_INJECTOR | Freq: Every day | SUBCUTANEOUS | 11 refills | Status: DC

## 2019-01-10 NOTE — Progress Notes (Signed)
Gynecology Office Visit  Chief Complaint:  Chief Complaint  Patient presents with  . Weight Check    History of Present Illness: Patientis a 48 y.o. 982P2002 female, who presents for the evaluation of the desire to lose weight. She has lost 4 pounds 3 months, for 5 month total weight loss of 12lbs. The patient states the following symptoms since starting her weight loss therapy: appetite suppression, energy, and weight loss.  She does report nausea.  Has not been able to titrate up to the full 3.0mg  dose of saxenda.  However, she was down almost and additional 5lbs on her home scale last weeks to 17lbs for the past 5 months and 9lbs on Saxenda.     Review of Systems: 10 point review of systems negative unless otherwise noted in HPI  Past Medical History:  Past Medical History:  Diagnosis Date  . Bartholin cyst   . Migraines    migraines, sinus, stress    Past Surgical History:  Past Surgical History:  Procedure Laterality Date  . APPENDECTOMY    . CESAREAN SECTION    . IMAGE GUIDED SINUS SURGERY N/A 03/20/2016   Procedure: IMAGE GUIDED SINUS SURGERY;  Surgeon: Linus Salmonshapman McQueen, MD;  [facility];  Service: ENT;  Laterality: N/A;  STRYKER GAVE DISK TO CE[facility] 12-22 KP sent 2nd disk gave to cece 1-3 kp  . LASIK    . MAXILLARY ANTROSTOMY Bilateral 03/20/2016   Procedure: MAXILLARY ANTROSTOMY WITH TISSUE REMOVAL;  Surgeon: Linus Salmonshapman McQueen, MD;  [facility];  Service: ENT;  Laterality: Bilateral;  . SEPTOPLASTY N/A 03/20/2016   Procedure: SEPTOPLASTY;  Surgeon: Linus Salmonshapman McQueen, MD;  [facility];  Service: ENT;  Laterality: N/A;  . TONSILLECTOMY    . TURBINATE REDUCTION Bilateral 03/20/2016   Procedure: TURBINATE REDUCTION;  Surgeon: Linus Salmonshapman McQueen, MD;  [facility];  Service: ENT;  Laterality: Bilateral;    Gynecologic History: No LMP recorded. (Menstrual status: Irregular Periods).  Obstetric History: [id number]   Family History:  History reviewed. No pertinent family history.  Social History:  Social History   Socioeconomic History  . Marital status: Married    Spouse name: Not on file  . Number of children: Not on file  . Years of education: Not on file  . Highest education level: Not on file  Occupational History  . Not on file  Social Needs  . Financial resource strain: Not on file  . Food insecurity    Worry: Not on file    Inability: Not on file  . Transportation needs    Medical: Not on file    Non-medical: Not on file  Tobacco Use  . Smoking status: Never Smoker  . Smokeless tobacco: Never Used  Substance and Sexual Activity  . Alcohol use: Yes    Comment: 1x/month  . Drug use: Never  . Sexual activity: Yes    Birth control/protection: None  Lifestyle  . Physical activity    Days per week: Not on file    Minutes per session: Not on file  . Stress: Not on file  Relationships  . Social Musicianconnections    Talks on phone: Not on file    Gets together: Not on file    Attends religious service: Not on file    Active member of club or organization: Not on file    Attends meetings of clubs or organizations: Not on file    Relationship status: Not on file  .  Intimate partner violence    Fear of current or ex partner: Not on file    Emotionally abused: Not on file    Physically abused: Not on file    Forced sexual activity: Not on file  Other Topics Concern  . Not on file  Social History Narrative  . Not on file    Allergies:  No Known Allergies  Medications: Prior to Admission medications   Medication Sig Start Date End Date Taking? Authorizing Provider  acetaminophen (TYLENOL) 500 MG tablet Take 2 tablets (1,000 mg total) by mouth every 6 (six) hours as needed for moderate pain, fever or headache. 01/11/15  Yes Betancourt, Aura Fey, NP  almotriptan (AXERT) 12.5 MG tablet Take 12.5 mg by mouth as needed for migraine. may repeat in 2 hours if needed   Yes [provider]  cetirizine (ZYRTEC) 10 MG tablet Take 10 mg by mouth daily.   Yes [provider]  Cholecalciferol (VITAMIN D-1000 MAX ST PO) Take by mouth daily.   Yes [provider]  cyclobenzaprine (FLEXERIL) 10 MG tablet Take 10 mg by mouth as needed (tension headaches).   Yes [provider]  FLUoxetine (PROZAC) 40 MG capsule Take 40 mg by mouth daily.   Yes [provider]  fluticasone (FLONASE) 50 MCG/ACT nasal spray fluticasone propionate 50 mcg/actuation nasal spray,suspension   Yes [provider]  ibuprofen (ADVIL) 200 MG tablet Take by mouth.   Yes [provider]  Insulin Pen Needle (NOVOFINE) 32G X 6 MM MISC 1 Units by Does not apply route daily. 10/04/18  Yes Malachy Mood, MD  Liraglutide -Weight Management (SAXENDA) 18 MG/3ML SOPN Inject 3 mg into the skin daily. 10/04/18  Yes Malachy Mood, MD  promethazine (PHENERGAN) 12.5 MG tablet Take by mouth. 12/18/16  Yes [provider]  topiramate (TOPAMAX) 50 MG tablet Take 50 mg by mouth 2 (two) times daily.   Yes [provider]    Physical Exam Blood pressure 112/80, pulse 75, height 5\' 7"  (1.702 m), weight 285 lb (129.3 kg). Wt Readings from Last 3 Encounters:  01/10/19 285 lb (129.3 kg)  10/04/18 289 lb (131.1 kg)  09/05/18 291 lb (132 kg)  Body mass index is 44.64 kg/m.]   General: NAD, well nourished, appears stated age 83: normocephalic, anicteric Thyroid: no enlargement Pulmonary: no increased work of breathing Neurologic: Grossly intact Psychiatric: mood appropriate, affect full  Assessment: 48 y.o. [id number] medical weight loss follow up  Plan: Problem List Items Addressed This Visit    None    Visit Diagnoses    Class 3 severe obesity without serious comorbidity with body mass index (BMI) of 45.0 to 49.9 in adult, unspecified obesity type ([place])    -  Primary      1) 1500 Calorie ADA Diet  2) Patient encouraged to continue  with appropriate lifestyle changes for weight loss including: regular physical activity, healthy coping strategies, caloric restriction and healthy eating patterns.  3) Patient to take medication, with the benefits of appetite suppression and metabolism boost d/w pt, along with the side effects and risk factors of long term use that will be avoided with our use of short bursts of therapy. Rx provided. Will continue Saxenda, follow up in 4 months to reassess    4) 15 minutes face-to-face; with counseling/coordination of care > 50 percent of visit related to obesity and ongoing management/treatment   6)  Return in about 4 months (around 05/13/2019) for medication follow up.  Vena Austria, MD, Evern Core [facility], [facility] 01/10/2019, 8:24 AM

## 2019-01-10 NOTE — Progress Notes (Signed)
Gastroenterology Consultation  Referring Provider:     Linus Salmons, MD Primary Care Physician:  Patient, No Pcp Per Primary Gastroenterologist:  Dr. Servando Snare     Reason for Consultation:     Globus        HPI:   Judith Barrett is a 48 y.o. y/o female referred for consultation & management of globus by Dr. Patient, No Pcp Per.  This patient comes in today with a history of a fullness in her throat.  The patient also reports that she had been having some heartburn but states that it is only been since the beginning of the year and usually only a few times a week.  The patient was started on a PPI and states that her heartburn went away but she still has a globus feeling. The patient was started on omeprazole 40 mg a day and reports that she has not noticed much of a change.  She has been trying to lose weight and states that she was put on Saxenda for weight loss and states she has lost 16 pounds over the last few months.  There is no report of any change in bowel habits black stools or bloody stools.  She also denies any family history of colon cancer or colon polyps.   Past Medical History:  Diagnosis Date  . Bartholin cyst   . Migraines    migraines, sinus, stress    Past Surgical History:  Procedure Laterality Date  . APPENDECTOMY    . CESAREAN SECTION    . IMAGE GUIDED SINUS SURGERY N/A 03/20/2016   Procedure: IMAGE GUIDED SINUS SURGERY;  Surgeon: Linus Salmons, MD;  Location: [facility];  Service: ENT;  Laterality: N/A;  STRYKER GAVE DISK TO CECE 12-22 KP sent 2nd disk gave to cece 1-3 kp  . LASIK    . MAXILLARY ANTROSTOMY Bilateral 03/20/2016   Procedure: MAXILLARY ANTROSTOMY WITH TISSUE REMOVAL;  Surgeon: Linus Salmons, MD;  Location: [facility];  Service: ENT;  Laterality: Bilateral;  . SEPTOPLASTY N/A 03/20/2016   Procedure: SEPTOPLASTY;  Surgeon: Linus Salmons, MD;  Location: [facility];  Service: ENT;  Laterality: N/A;  . TONSILLECTOMY     . TURBINATE REDUCTION Bilateral 03/20/2016   Procedure: TURBINATE REDUCTION;  Surgeon: Linus Salmons, MD;  Location: [facility];  Service: ENT;  Laterality: Bilateral;    Prior to Admission medications   Medication Sig Start Date End Date Taking? Authorizing Provider  acetaminophen (TYLENOL) 500 MG tablet Take 2 tablets (1,000 mg total) by mouth every 6 (six) hours as needed for moderate pain, fever or headache. 01/11/15   Betancourt, Jarold Song, NP  almotriptan (AXERT) 12.5 MG tablet Take 12.5 mg by mouth as needed for migraine. may repeat in 2 hours if needed    [provider]  cetirizine (ZYRTEC) 10 MG tablet Take 10 mg by mouth daily.    [provider]  Cholecalciferol (VITAMIN D-1000 MAX ST PO) Take by mouth daily.    [provider]  cyclobenzaprine (FLEXERIL) 10 MG tablet Take 10 mg by mouth as needed (tension headaches).    [provider]  FLUoxetine (PROZAC) 40 MG capsule Take 40 mg by mouth daily.    [provider]  fluticasone (FLONASE) 50 MCG/ACT nasal spray fluticasone propionate 50 mcg/actuation nasal spray,suspension    [provider]  ibuprofen (ADVIL) 200 MG tablet Take by mouth.    [provider]  Insulin Pen Needle (NOVOFINE) 32G X 6  MM MISC 1 Units by Does not apply route daily. 10/04/18   Malachy Mood, MD  Liraglutide -Weight Management (SAXENDA) 18 MG/3ML SOPN Inject 3 mg into the skin daily. 01/10/19   Malachy Mood, MD  promethazine (PHENERGAN) 12.5 MG tablet Take by mouth. 12/18/16   [provider]  topiramate (TOPAMAX) 50 MG tablet Take 50 mg by mouth 2 (two) times daily.    [provider]    No family history on file.   Social History   Tobacco Use  . Smoking status: Never Smoker  . Smokeless tobacco: Never Used  Substance Use Topics  . Alcohol use: Yes    Comment: 1x/month  . Drug use: Never    Allergies as of 01/10/2019  . (No Known Allergies)     Review of Systems:    All systems reviewed and negative except where noted in HPI.   Physical Exam:  There were no vitals taken for this visit. No LMP recorded. (Menstrual status: Irregular Periods). General:   Alert,  Well-developed, well-nourished, pleasant and cooperative in NAD Head:  Normocephalic and atraumatic. Eyes:  Sclera clear, no icterus.   Conjunctiva pink. Ears:  Normal auditory acuity. Neck:  Supple; no masses or thyromegaly. Lungs:  Respirations even and unlabored.  Clear throughout to auscultation.   No wheezes, crackles, or rhonchi. No acute distress. Heart:  Regular rate and rhythm; no murmurs, clicks, rubs, or gallops. Abdomen:  Normal bowel sounds.  No bruits.  Soft, non-tender and non-distended without masses, hepatosplenomegaly or hernias noted.  No guarding or rebound tenderness.  Negative Carnett sign.   Rectal:  Deferred.  Msk:  Symmetrical without gross deformities.  Good, equal movement & strength bilaterally. Pulses:  Normal pulses noted. Extremities:  No clubbing or edema.  No cyanosis. Neurologic:  Alert and oriented x3;  grossly normal neurologically. Skin:  Intact without significant lesions or rashes.  No jaundice. Lymph Nodes:  No significant cervical adenopathy. Psych:  Alert and cooperative. Normal mood and affect.  Imaging Studies: No results found.  Assessment and Plan:   Judith Barrett is a 48 y.o. y/o female who comes in with a report of fullness in her throat without any dysphagia.  The patient has been tried on omeprazole without much help.  The patient has been told that I would like her to start taking Dexilant once a day to see if this helps her symptoms.  If the symptoms do not improve then the patient may need to undergo an upper endoscopy versus a pH study if reflux is causing her symptoms.  The patient has been explained the plan and agrees with it.  This visit consisted of 25 minutes face to face contact with myself and at least  50% of this time was spent in counseling and education regarding diagnosis, treatment options, medication management, risks and benefits of treatment.  Judith Lame, MD. Marval Regal    Note: This dictation was prepared with Dragon dictation along with smaller phrase technology. Any transcriptional errors that result from this process are unintentional.

## 2019-01-19 ENCOUNTER — Telehealth: Payer: Self-pay | Admitting: Gastroenterology

## 2019-01-19 ENCOUNTER — Other Ambulatory Visit: Payer: Self-pay

## 2019-01-19 MED ORDER — DEXILANT 60 MG PO CPDR: 60.0000 mg | DELAYED_RELEASE_CAPSULE | Freq: Every day | ORAL | 6 refills | Status: DC

## 2019-01-19 NOTE — Telephone Encounter (Signed)
Rx for Dexilant has been sent to pt's pharmacy.

## 2019-01-19 NOTE — Telephone Encounter (Signed)
Pt left vm she saw Dr. Allen Norris and received samples of Dexilant she would like a prescription for please call pt to inform you received her message

## 2019-04-26 ENCOUNTER — Ambulatory Visit: Payer: BC Managed Care – PPO | Attending: Internal Medicine

## 2019-04-26 ENCOUNTER — Other Ambulatory Visit: Payer: BC Managed Care – PPO

## 2019-04-26 DIAGNOSIS — Z20822 Contact with and (suspected) exposure to covid-19: Secondary | ICD-10-CM

## 2019-04-27 LAB — NOVEL CORONAVIRUS, NAA: SARS-CoV-2, NAA: NOT DETECTED

## 2019-04-28 ENCOUNTER — Telehealth: Payer: Self-pay

## 2019-04-28 NOTE — Telephone Encounter (Signed)
Pt calling; is having a period p not having it for 6-9 months; blood clots; pretty heavy.  Is this normal?  Was sick earlier in the week c fever, chills, and GI issues.  Is this related?  Is she going in to Menopause?   [phone]  Pt states she has been having spotty bleeding for her periods.  She is not saturating a pad q80min-1hr but is close to it.  Using condoms for bc.  Tx'd to [facility] for scheduling.

## 2019-05-01 NOTE — Progress Notes (Deleted)
Patient, No Pcp Per   No chief complaint on file.   HPI:      [name] is a 49 y.o. [phone] who LMP was No LMP recorded. (Menstrual status: Irregular Periods)., presents today for AUB Last pap normal 5/20   Past Medical History:  Diagnosis Date  . Bartholin cyst   . Migraines    migraines, sinus, stress    Past Surgical History:  Procedure Laterality Date  . APPENDECTOMY    . CESAREAN SECTION    . IMAGE GUIDED SINUS SURGERY N/A 03/20/2016   Procedure: IMAGE GUIDED SINUS SURGERY;  Surgeon: Beverly Gust, MD;  Location: [place];  Service: ENT;  Laterality: N/A;  STRYKER GAVE DISK TO CECE 12-22 KP sent 2nd disk gave to cece 1-3 kp  . LASIK    . MAXILLARY ANTROSTOMY Bilateral 03/20/2016   Procedure: MAXILLARY ANTROSTOMY WITH TISSUE REMOVAL;  Surgeon: Beverly Gust, MD;  Location: [place];  Service: ENT;  Laterality: Bilateral;  . SEPTOPLASTY N/A 03/20/2016   Procedure: SEPTOPLASTY;  Surgeon: Beverly Gust, MD;  Location: [place];  Service: ENT;  Laterality: N/A;  . TONSILLECTOMY    . TURBINATE REDUCTION Bilateral 03/20/2016   Procedure: TURBINATE REDUCTION;  Surgeon: Beverly Gust, MD;  Location: [place];  Service: ENT;  Laterality: Bilateral;    No family history on file.  Social History   Socioeconomic History  . Marital status: Married    Spouse name: Not on file  . Number of children: Not on file  . Years of education: Not on file  . Highest education level: Not on file  Occupational History  . Not on file  Tobacco Use  . Smoking status: Never Smoker  . Smokeless tobacco: Never Used  Substance and Sexual Activity  . Alcohol use: Yes    Comment: 1x/month  . Drug use: Never  . Sexual activity: Yes    Birth control/protection: None  Other Topics Concern  . Not on file  Social History Narrative  . Not on file   Social Determinants of Health   Financial Resource Strain:   . Difficulty of  Paying Living Expenses: Not on file  Food Insecurity:   . Worried About Charity fundraiser in the Last Year: Not on file  . Ran Out of Food in the Last Year: Not on file  Transportation Needs:   . Lack of Transportation (Medical): Not on file  . Lack of Transportation (Non-Medical): Not on file  Physical Activity:   . Days of Exercise per Week: Not on file  . Minutes of Exercise per Session: Not on file  Stress:   . Feeling of Stress : Not on file  Social Connections:   . Frequency of Communication with Friends and Family: Not on file  . Frequency of Social Gatherings with Friends and Family: Not on file  . Attends Religious Services: Not on file  . Active Member of Clubs or Organizations: Not on file  . Attends Archivist Meetings: Not on file  . Marital Status: Not on file  Intimate Partner Violence:   . Fear of Current or Ex-Partner: Not on file  . Emotionally Abused: Not on file  . Physically Abused: Not on file  . Sexually Abused: Not on file    Outpatient Medications Prior to Visit  Medication Sig Dispense Refill  . acetaminophen (TYLENOL) 500 MG tablet Take 2 tablets (1,000 mg total) by mouth every 6 (six) hours  as needed for moderate pain, fever or headache. 30 tablet 0  . almotriptan (AXERT) 12.5 MG tablet Take 12.5 mg by mouth as needed for migraine. may repeat in 2 hours if needed    . cetirizine (ZYRTEC) 10 MG tablet Take 10 mg by mouth daily.    . Cholecalciferol (VITAMIN D-1000 MAX ST PO) Take by mouth daily.    . cyclobenzaprine (FLEXERIL) 10 MG tablet Take 10 mg by mouth as needed (tension headaches).    Marland Kitchen dexlansoprazole (DEXILANT) 60 MG capsule Take 1 capsule (60 mg total) by mouth daily. 30 capsule 6  . FLUoxetine (PROZAC) 40 MG capsule Take 40 mg by mouth daily.    . fluticasone (FLONASE) 50 MCG/ACT nasal spray fluticasone propionate 50 mcg/actuation nasal spray,suspension    . ibuprofen (ADVIL) 200 MG tablet Take by mouth.    . Insulin Pen Needle  (NOVOFINE) 32G X 6 MM MISC 1 Units by Does not apply route daily. 100 each 3  . Liraglutide -Weight Management (SAXENDA) 18 MG/3ML SOPN Inject 3 mg into the skin daily. 5 pen 11  . Multiple Vitamin (ONE DAILY) tablet Take by mouth.    Marland Kitchen omeprazole (PRILOSEC) 40 MG capsule Take 40 mg by mouth daily.    . promethazine (PHENERGAN) 12.5 MG tablet Take by mouth.    . topiramate (TOPAMAX) 50 MG tablet Take 50 mg by mouth 2 (two) times daily.     No facility-administered medications prior to visit.      ROS:  Review of Systems BREAST: No symptoms   OBJECTIVE:   Vitals:  There were no vitals taken for this visit.  Physical Exam  Results: No results found for this or any previous visit (from the past 24 hour(s)).   Assessment/Plan: No diagnosis found.    No orders of the defined types were placed in this encounter.     No follow-ups on file.  Montreal Steidle B. Cinsere Mizrahi, PA-C 05/01/2019 4:12 PM

## 2019-05-02 ENCOUNTER — Ambulatory Visit: Payer: BC Managed Care – PPO | Admitting: Obstetrics and Gynecology

## 2019-05-15 ENCOUNTER — Ambulatory Visit (INDEPENDENT_AMBULATORY_CARE_PROVIDER_SITE_OTHER): Payer: BC Managed Care – PPO | Admitting: Obstetrics and Gynecology

## 2019-05-15 ENCOUNTER — Other Ambulatory Visit: Payer: Self-pay

## 2019-05-15 ENCOUNTER — Encounter: Payer: Self-pay | Admitting: Obstetrics and Gynecology

## 2019-05-15 VITALS — BP 130/80 | HR 105 | Ht 67.0 in | Wt 294.0 lb

## 2019-05-15 DIAGNOSIS — Z6841 Body Mass Index (BMI) 40.0 and over, adult: Secondary | ICD-10-CM

## 2019-05-15 DIAGNOSIS — Z713 Dietary counseling and surveillance: Secondary | ICD-10-CM | POA: Diagnosis not present

## 2019-05-15 DIAGNOSIS — N938 Other specified abnormal uterine and vaginal bleeding: Secondary | ICD-10-CM

## 2019-05-15 DIAGNOSIS — N939 Abnormal uterine and vaginal bleeding, unspecified: Secondary | ICD-10-CM

## 2019-05-15 MED ORDER — PHENTERMINE HCL 37.5 MG PO TABS: 37.5000 mg | ORAL_TABLET | Freq: Every day | ORAL | 0 refills | Status: DC

## 2019-05-15 NOTE — Progress Notes (Addendum)
Gynecology Office Visit  Chief Complaint:  Chief Complaint  Patient presents with  . Weight Check    History of Present Illness: Patientis a 49 y.o. G50P2002 female, who presents for the evaluation of the desire to lose weight. Is up 6lbs since last visit in October.  Did not continue on Saxenda secondary to hesitancy of daily injection as well as nausea.. The patient states the following symptoms since starting her weight loss therapy: appetite suppression, energy, and weight loss.  The patient also reports no other ill effects. The patient specifically denies heart palpitations, anxiety, and insomnia.   Also reports that her menstrual cycle in January was significantly heavier than her usual.  She did have a URI, negative COVID testing.  The patient has not had a pelvic ultrasound but did have MRI of the abdomen and pelvic 08/25/2018 for possible infected Bartholin's gland.  On that imaging study the uterus appeared structurally normal.  Prior to this the patient has not had any menstrual complaints.     Review of Systems: 10 point review of systems negative unless otherwise noted in HPI  Past Medical History:  Past Medical History:  Diagnosis Date  . Bartholin cyst   . Migraines    migraines, sinus, stress    Past Surgical History:  Past Surgical History:  Procedure Laterality Date  . APPENDECTOMY    . CESAREAN SECTION    . IMAGE GUIDED SINUS SURGERY N/A 03/20/2016   Procedure: IMAGE GUIDED SINUS SURGERY;  Surgeon: Linus Salmons, MD;  Location: [facility];  Service: ENT;  Laterality: N/A;  STRYKER GAVE DISK TO CECE 12-22 KP sent 2nd disk gave to cece 1-3 kp  . LASIK    . MAXILLARY ANTROSTOMY Bilateral 03/20/2016   Procedure: MAXILLARY ANTROSTOMY WITH TISSUE REMOVAL;  Surgeon: Linus Salmons, MD;  Location: [facility];  Service: ENT;  Laterality: Bilateral;  . SEPTOPLASTY N/A 03/20/2016   Procedure: SEPTOPLASTY;  Surgeon: Linus Salmons, MD;  Location:  [facility];  Service: ENT;  Laterality: N/A;  . TONSILLECTOMY    . TURBINATE REDUCTION Bilateral 03/20/2016   Procedure: TURBINATE REDUCTION;  Surgeon: Linus Salmons, MD;  Location: [facility];  Service: ENT;  Laterality: Bilateral;    Gynecologic History: Patient's last menstrual period was 04/26/2019.  Obstetric History: [id number]  Family History:  No family history on file.  Social History:  Social History   Socioeconomic History  . Marital status: Married    Spouse name: Not on file  . Number of children: Not on file  . Years of education: Not on file  . Highest education level: Not on file  Occupational History  . Not on file  Tobacco Use  . Smoking status: Never Smoker  . Smokeless tobacco: Never Used  Substance and Sexual Activity  . Alcohol use: Yes    Comment: 1x/month  . Drug use: Never  . Sexual activity: Yes    Birth control/protection: None  Other Topics Concern  . Not on file  Social History Narrative  . Not on file   Social Determinants of Health   Financial Resource Strain:   . Difficulty of Paying Living Expenses: Not on file  Food Insecurity:   . Worried About Programme researcher, broadcasting/film/video in the Last Year: Not on file  . Ran Out of Food in the Last Year: Not on file  Transportation Needs:   . Lack of Transportation (Medical): Not on file  . Lack of Transportation (Non-Medical): Not  on file  Physical Activity:   . Days of Exercise per Week: Not on file  . Minutes of Exercise per Session: Not on file  Stress:   . Feeling of Stress : Not on file  Social Connections:   . Frequency of Communication with Friends and Family: Not on file  . Frequency of Social Gatherings with Friends and Family: Not on file  . Attends Religious Services: Not on file  . Active Member of Clubs or Organizations: Not on file  . Attends Archivist Meetings: Not on file  . Marital Status: Not on file  Intimate Partner Violence:   . Fear of Current or  Ex-Partner: Not on file  . Emotionally Abused: Not on file  . Physically Abused: Not on file  . Sexually Abused: Not on file    Allergies:  No Known Allergies  Medications: Prior to Admission medications   Medication Sig Start Date End Date Taking? Authorizing Provider  acetaminophen (TYLENOL) 500 MG tablet Take 2 tablets (1,000 mg total) by mouth every 6 (six) hours as needed for moderate pain, fever or headache. 01/11/15  Yes Betancourt, Aura Fey, NP  almotriptan (AXERT) 12.5 MG tablet Take 12.5 mg by mouth as needed for migraine. may repeat in 2 hours if needed   Yes [provider]  cetirizine (ZYRTEC) 10 MG tablet Take 10 mg by mouth daily.   Yes [provider]  Cholecalciferol (VITAMIN D-1000 MAX ST PO) Take by mouth daily.   Yes [provider]  cyclobenzaprine (FLEXERIL) 10 MG tablet Take 10 mg by mouth as needed (tension headaches).   Yes [provider]  dexlansoprazole (DEXILANT) 60 MG capsule Take 1 capsule (60 mg total) by mouth daily. 01/19/19  Yes Lucilla Lame, MD  FLUoxetine (PROZAC) 40 MG capsule Take 40 mg by mouth daily.   Yes [provider]  fluticasone (FLONASE) 50 MCG/ACT nasal spray fluticasone propionate 50 mcg/actuation nasal spray,suspension   Yes [provider]  ibuprofen (ADVIL) 200 MG tablet Take by mouth.   Yes [provider]  Insulin Pen Needle (NOVOFINE) 32G X 6 MM MISC 1 Units by Does not apply route daily. 10/04/18  Yes Malachy Mood, MD  Liraglutide -Weight Management (SAXENDA) 18 MG/3ML SOPN Inject 3 mg into the skin daily. 01/10/19  Yes Malachy Mood, MD  Multiple Vitamin (ONE DAILY) tablet Take by mouth.   Yes [provider]  omeprazole (PRILOSEC) 40 MG capsule Take 40 mg by mouth daily. 12/10/18  Yes [provider]  promethazine (PHENERGAN) 12.5 MG tablet Take by mouth. 12/18/16  Yes [provider]  topiramate (TOPAMAX) 50 MG tablet Take 50 mg by mouth 2  (two) times daily.   Yes [provider]    Physical Exam Blood pressure 130/80, pulse (!) 105, height 5\' 7"  (1.702 m), weight 294 lb (133.4 kg), last menstrual period 04/26/2019. Wt Readings from Last 3 Encounters:  05/15/19 294 lb (133.4 kg)  01/10/19 288 lb 12.8 oz (131 kg)  01/10/19 285 lb (129.3 kg)    General: NAD HEENT: normocephalic, anicteric Thyroid: no enlargement Pulmonary: no increased work of breathing Neurologic: Grossly intact Psychiatric: mood appropriate, affect full  Assessment: 49 y.o. [id number] medical weight loss follow up and AUB  Plan: Problem List Items Addressed This Visit    None    Visit Diagnoses    Abnormal uterine bleeding    -  Primary   Class 3 severe obesity without serious comorbidity with body mass index (  BMI) of 45.0 to 49.9 in adult, unspecified obesity type (HCC)   (Chronic)     Relevant Medications   phentermine (ADIPEX-P) 37.5 MG tablet      1) 1500 Calorie ADA Diet  2) Patient education given regarding appropriate lifestyle changes for weight loss including: regular physical activity, healthy coping strategies, caloric restriction and healthy eating patterns.  3) Patient to take medication, with the benefits of appetite suppression and metabolism boost d/w pt, along with the side effects and risk factors of long term use that will be avoided with our use of short bursts of therapy. Rx provided.   - switch back to phentermine from saxenda   4) 15 minutes face-to-face; with counseling/coordination of care > 50 percent of visit related to obesity and ongoing management/treatment   5)  AUB - heavier bleeding in January around time of URI negative COVID.  Had pelvic MRI 08/2018 chooses expectant management at present. If repeat episode would proceed with TVUS to evalute endometrial stripe for possible polyps or thickening.    6) Return in about 4 weeks (around 06/12/2019) for medication follow up (phone, video, or in  office).   Vena Austria, MD, [facility], [facility] 05/15/2019, 8:22 AM

## 2019-06-21 ENCOUNTER — Telehealth: Payer: BC Managed Care – PPO | Admitting: Nurse Practitioner

## 2019-06-21 DIAGNOSIS — B372 Candidiasis of skin and nail: Secondary | ICD-10-CM

## 2019-06-21 MED ORDER — NYSTATIN 100000 UNIT/GM EX CREA: 1.0000 "application " | TOPICAL_CREAM | Freq: Two times a day (BID) | CUTANEOUS | 0 refills | Status: DC

## 2019-06-21 NOTE — Progress Notes (Signed)
E Visit for Rash  We are sorry that you are not feeling well. Here is how we plan to help!     Based upon your presentation it appears you have a fungal infection.  I have prescribed: and Nystatin cream apply to the affected area twice daily   HOME CARE:   Take cool showers and avoid direct sunlight.  Apply cool compress or wet dressings.  Take a bath in an oatmeal bath.  Sprinkle content of one Aveeno packet under running faucet with comfortably warm water.  Bathe for 15-20 minutes, 1-2 times daily.  Pat dry with a towel. Do not rub the rash.  Use hydrocortisone cream.  Take an antihistamine like Benadryl for widespread rashes that itch.  The adult dose of Benadryl is 25-50 mg by mouth 4 times daily.  Caution:  This type of medication may cause sleepiness.  Do not drink alcohol, drive, or operate dangerous machinery while taking antihistamines.  Do not take these medications if you have prostate enlargement.  Read package instructions thoroughly on all medications that you take.  GET HELP RIGHT AWAY IF:   Symptoms don't go away after treatment.  Severe itching that persists.  If you rash spreads or swells.  If you rash begins to smell.  If it blisters and opens or develops a yellow-brown crust.  You develop a fever.  You have a sore throat.  You become short of breath.  MAKE SURE YOU:  Understand these instructions. Will watch your condition. Will get help right away if you are not doing well or get worse.  Thank you for choosing an e-visit. Your e-visit answers were reviewed by a board certified advanced clinical practitioner to complete your personal care plan. Depending upon the condition, your plan could have included both over the counter or prescription medications. Please review your pharmacy choice. Be sure that the pharmacy you have chosen is open so that you can pick up your prescription now.  If there is a problem you may message your provider in  MyChart to have the prescription routed to another pharmacy. Your safety is important to us. If you have drug allergies check your prescription carefully.  For the next 24 hours, you can use MyChart to ask questions about today's visit, request a non-urgent call back, or ask for a work or school excuse from your e-visit provider. You will get an email in the next two days asking about your experience. I hope that your e-visit has been valuable and will speed your recovery.   5-10 minutes spent reviewing and documenting in chart.    

## 2019-09-14 ENCOUNTER — Ambulatory Visit (INDEPENDENT_AMBULATORY_CARE_PROVIDER_SITE_OTHER)
Admission: RE | Admit: 2019-09-14 | Discharge: 2019-09-14 | Disposition: A | Discharge status: Home / Self Care | Payer: 59 | Source: Ambulatory Visit

## 2019-09-14 DIAGNOSIS — J3089 Other allergic rhinitis: Secondary | ICD-10-CM

## 2019-09-14 DIAGNOSIS — R0981 Nasal congestion: Secondary | ICD-10-CM | POA: Diagnosis not present

## 2019-09-14 MED ORDER — PSEUDOEPH-BROMPHEN-DM 30-2-10 MG/5ML PO SYRP: 10.0000 mL | ORAL_SOLUTION | Freq: Four times a day (QID) | ORAL | 0 refills | Status: DC | PRN

## 2019-09-14 NOTE — ED Provider Notes (Signed)
[facility]  Virtual Visit via Video Note:  Judith Barrett  initiated request for Telemedicine visit with [facility] team. I connected with Judith Barrett  on 09/14/2019 at 11:47 AM  for a synchronized telemedicine visit using a video enabled HIPPA compliant telemedicine application. I verified that I am speaking with Judith Barrett  using two identifiers. Marykay Lex, NP  was physically located in a [facility] Urgent care site and Judith Barrett was located at a different location.   The limitations of evaluation and management by telemedicine as well as the availability of in-person appointments were discussed. Patient was informed that she  may incur a bill ( including co-pay) for this virtual visit encounter. Judith Barrett  expressed understanding and gave verbal consent to proceed with virtual visit.   [id number] 09/14/19 Arrival Time: 1139  CC: ALLERGIC RHINITIS  SUBJECTIVE: History from: patient.  Judith Barrett is a 49 y.o. female who presents with abrupt onset of cough, rhinorrhea, nasal congestion, right ear fullness.  She has been taking Advil Cold and Sinus with little relief.  Reports that the pain in congestion is worse in the morning.  Denies sick contacts.  Reports that she does have issues with seasonal allergies.  Denies fever, chills, fatigue, ear pain, sinus pain, SOB, wheezing, chest pain, nausea, rash, changes in bowel or bladder habits.    ROS: As per HPI.  All other pertinent ROS negative.     Past Medical History:  Diagnosis Date  . Bartholin cyst   . Migraines    migraines, sinus, stress   Past Surgical History:  Procedure Laterality Date  . APPENDECTOMY    . CESAREAN SECTION    . IMAGE GUIDED SINUS SURGERY N/A 03/20/2016   Procedure: IMAGE GUIDED SINUS SURGERY;  Surgeon: Linus Salmons, MD;  Location: [facility];  Service: ENT;  Laterality: N/A;  STRYKER GAVE DISK TO CECE 12-22 KP sent 2nd disk  gave to cece 1-3 kp  . LASIK    . MAXILLARY ANTROSTOMY Bilateral 03/20/2016   Procedure: MAXILLARY ANTROSTOMY WITH TISSUE REMOVAL;  Surgeon: Linus Salmons, MD;  Location: [facility];  Service: ENT;  Laterality: Bilateral;  . SEPTOPLASTY N/A 03/20/2016   Procedure: SEPTOPLASTY;  Surgeon: Linus Salmons, MD;  Location: [facility];  Service: ENT;  Laterality: N/A;  . TONSILLECTOMY    . TURBINATE REDUCTION Bilateral 03/20/2016   Procedure: TURBINATE REDUCTION;  Surgeon: Linus Salmons, MD;  Location: [facility];  Service: ENT;  Laterality: Bilateral;   No Known Allergies No current facility-administered medications on file prior to encounter.   Current Outpatient Medications on File Prior to Encounter  Medication Sig Dispense Refill  . acetaminophen (TYLENOL) 500 MG tablet Take 2 tablets (1,000 mg total) by mouth every 6 (six) hours as needed for moderate pain, fever or headache. 30 tablet 0  . almotriptan (AXERT) 12.5 MG tablet Take 12.5 mg by mouth as needed for migraine. may repeat in 2 hours if needed    . cetirizine (ZYRTEC) 10 MG tablet Take 10 mg by mouth daily.    . Cholecalciferol (VITAMIN D-1000 MAX ST PO) Take by mouth daily.    . cyclobenzaprine (FLEXERIL) 10 MG tablet Take 10 mg by mouth as needed (tension headaches).    Marland Kitchen dexlansoprazole (DEXILANT) 60 MG capsule Take 1 capsule (60 mg total) by mouth daily. 30 capsule 6  . FLUoxetine (PROZAC) 40 MG capsule Take 40 mg by mouth daily.    Marland Kitchen  fluticasone (FLONASE) 50 MCG/ACT nasal spray fluticasone propionate 50 mcg/actuation nasal spray,suspension    . ibuprofen (ADVIL) 200 MG tablet Take by mouth.    . Multiple Vitamin (ONE DAILY) tablet Take by mouth.    . nystatin cream (MYCOSTATIN) Apply 1 application topically 2 (two) times daily. 30 g 0  . omeprazole (PRILOSEC) 40 MG capsule Take 40 mg by mouth daily.    . phentermine (ADIPEX-P) 37.5 MG tablet Take 1 tablet (37.5 mg total) by mouth daily before  breakfast. 30 tablet 0  . promethazine (PHENERGAN) 12.5 MG tablet Take by mouth.    . topiramate (TOPAMAX) 50 MG tablet Take 50 mg by mouth 2 (two) times daily.     Social History   Socioeconomic History  . Marital status: Married    Spouse name: Not on file  . Number of children: Not on file  . Years of education: Not on file  . Highest education level: Not on file  Occupational History  . Not on file  Tobacco Use  . Smoking status: Never Smoker  . Smokeless tobacco: Never Used  Vaping Use  . Vaping Use: Never used  Substance and Sexual Activity  . Alcohol use: Yes    Comment: 1x/month  . Drug use: Never  . Sexual activity: Yes    Birth control/protection: None  Other Topics Concern  . Not on file  Social History Narrative  . Not on file   Social Determinants of Health   Financial Resource Strain:   . Difficulty of Paying Living Expenses:   Food Insecurity:   . Worried About Programme researcher, broadcasting/film/video in the Last Year:   . Barista in the Last Year:   Transportation Needs:   . Freight forwarder (Medical):   Marland Kitchen Lack of Transportation (Non-Medical):   Physical Activity:   . Days of Exercise per Week:   . Minutes of Exercise per Session:   Stress:   . Feeling of Stress :   Social Connections:   . Frequency of Communication with Friends and Family:   . Frequency of Social Gatherings with Friends and Family:   . Attends Religious Services:   . Active Member of Clubs or Organizations:   . Attends Banker Meetings:   Marland Kitchen Marital Status:   Intimate Partner Violence:   . Fear of Current or Ex-Partner:   . Emotionally Abused:   Marland Kitchen Physically Abused:   . Sexually Abused:    No family history on file.  OBJECTIVE:   There were no vitals filed for this visit.  General appearance: alert; no distress Eyes: EOMI grossly HENT: normocephalic; atraumatic Neck: supple with FROM Lungs: normal respiratory effort; speaking in full sentences without  difficulty Extremities: moves extremities without difficulty Skin: No obvious rashes Neurologic: No facial asymmetries Psychological: alert and cooperative; normal mood and affect  ASSESSMENT & PLAN:  1. Allergic rhinitis due to other allergic trigger, unspecified seasonality   2. Nasal congestion     Meds ordered this encounter  Medications  . brompheniramine-pseudoephedrine-DM 30-2-10 MG/5ML syrup    Sig: Take 10 mLs by mouth 4 (four) times daily as needed.    Dispense:  120 mL    Refill:  0    Order Specific Question:   Supervising Provider    Answer:   Merrilee Jansky [id number]    Prescribe Bromfed Take as directed and to completion.  Drink warm or cool liquids, use throat lozenges, or popsicles to help alleviate  symptoms Take OTC ibuprofen or tylenol as needed for pain Follow up with PCP if symptoms persist Return or go to ER if you have any new or worsening symptoms such as fever, chills, nausea, vomiting, worsening sore throat, cough, abdominal pain, chest pain, changes in bowel or bladder habits  I discussed the assessment and treatment plan with the patient. The patient was provided an opportunity to ask questions and all were answered. The patient agreed with the plan and demonstrated an understanding of the instructions.   The patient was advised to call back or seek an in-person evaluation if the symptoms worsen or if the condition fails to improve as anticipated.  I provided of non-face-to-face time during this encounter.  Marykay Lex, NP  09/14/2019 11:47 AM         Moshe Cipro, NP 09/14/19 1147

## 2019-09-14 NOTE — Discharge Instructions (Addendum)
May continue Zyrtec  May also use Flonase  Sent in [place] for you to use up to 4 times daily as needed for cough and congestion  Follow-up as needed if symptoms are not improving

## 2019-09-19 IMAGING — MR MRI PELVIS WITHOUT AND WITH CONTRAST
6 of 10 series · 26 of 48 positions shown · IV contrast (Gadavist)
Comparison: None.

CLINICAL DATA: Infected Bartholin's gland cyst. Evaluate response
to antibiotic therapy

EXAM:
MRI PELVIS WITHOUT AND WITH CONTRAST
TECHNIQUE: Multiplanar multisequence MR imaging of the pelvis was performed
both before and after administration of intravenous contrast.
CONTRAST:  10 mL Gadavist

[Series 2: T2 · coronal · 5.0mm · 1.41mm/px · 2 of 40 slices shown (1 of 2)]
[im 1/40]
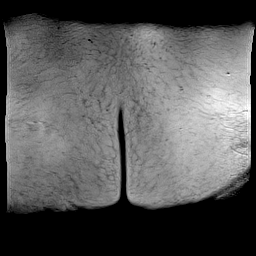
[im 40/40]
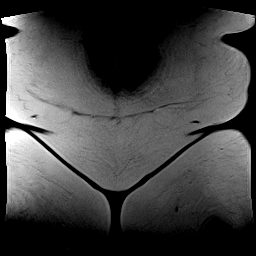

[Series 3: T2 · axial · 5.0mm · 0.75mm/px · z∈[-205,+47]mm · 3 of 43 slices shown (2 of 2)]
[im 1/43]
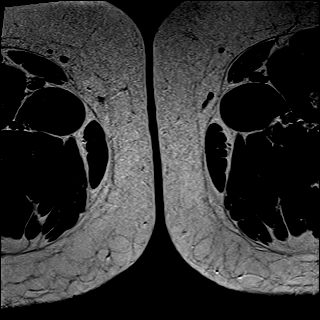
[im 22/43]
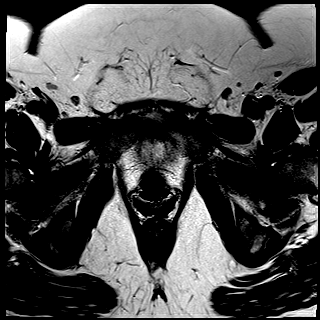
[im 43/43]
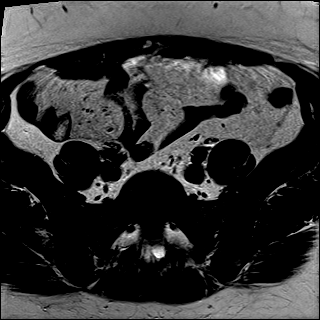

[Series 4: T2 fat-sat · axial · 5.0mm · 0.90mm/px · z∈[-205,+47]mm · 4 of 43 slices shown]
[im 1/43]
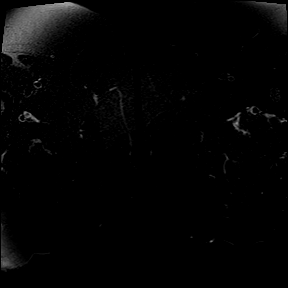
[im 15/43]
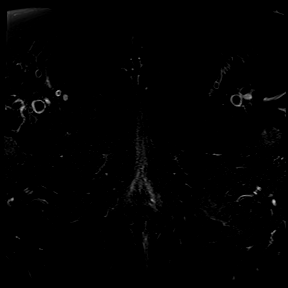
[im 29/43]
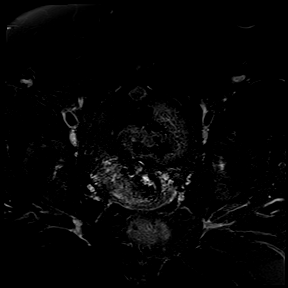
[im 43/43]
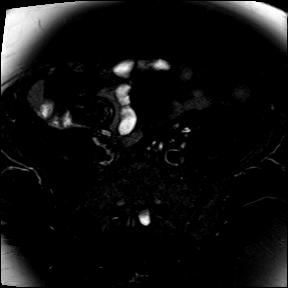

[Series 5: sag tse · sagittal · 5.0mm · 0.78mm/px · 3 of 33 slices shown]
[im 1/33]
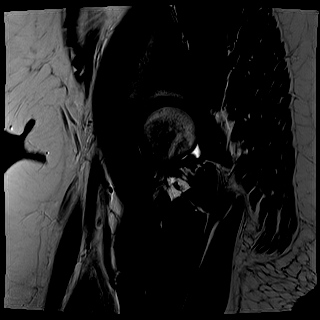
[im 17/33]
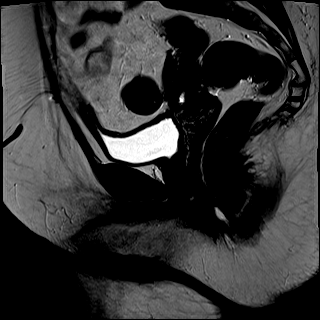
[im 33/33]
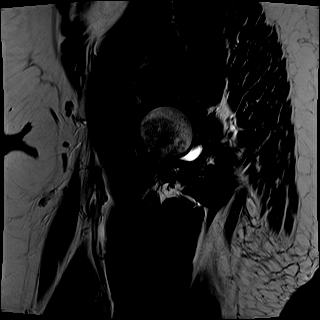

[Series 7: T1 dynamic fat-sat · axial · 3.0mm · 0.47mm/px · z∈[-197,+40]mm · 7 of 80 slices shown (1 of 2)]
[im 1/80]
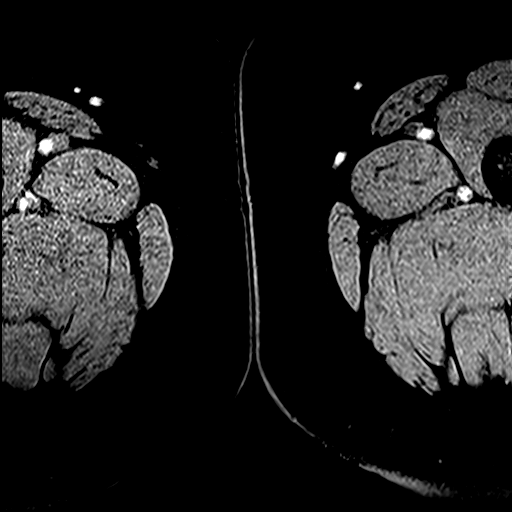
[im 14/80]
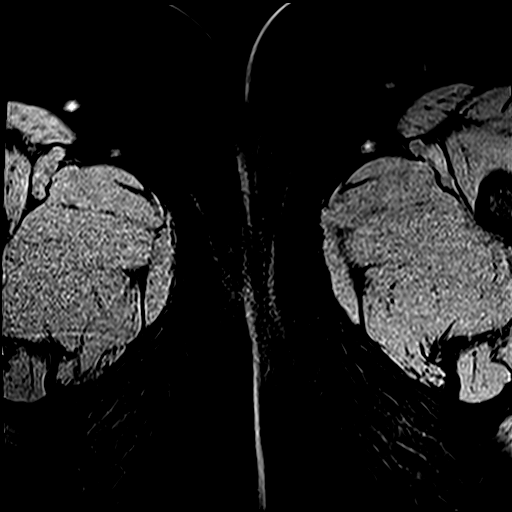
[im 27/80]
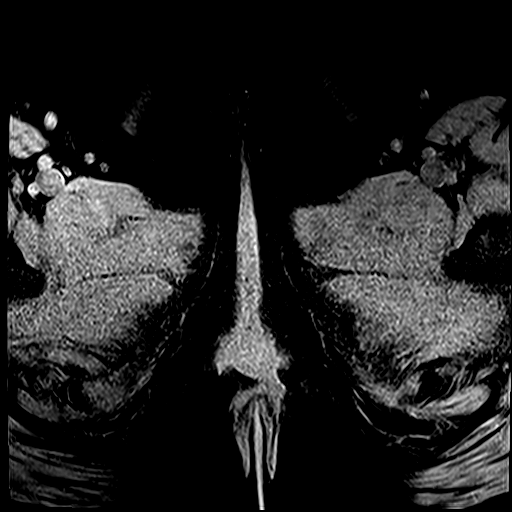
[im 40/80]
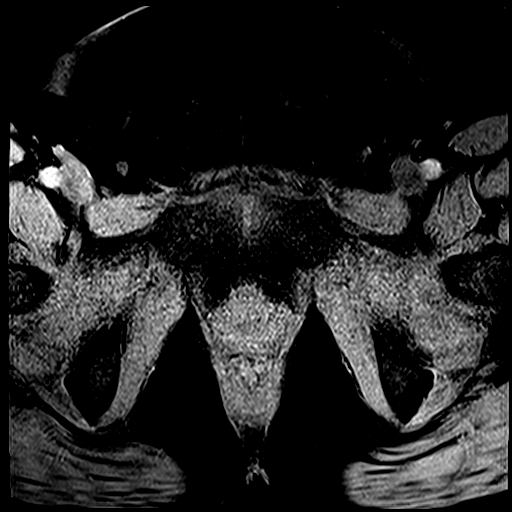
[im 53/80]
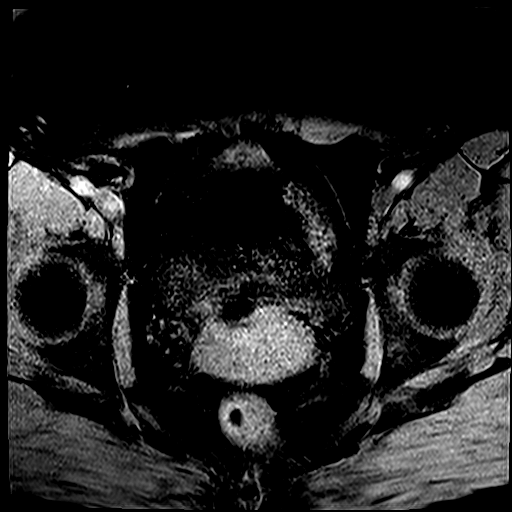
[im 66/80]
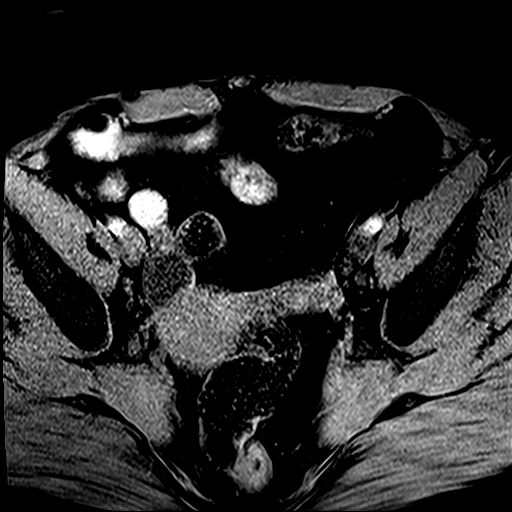
[im 80/80]
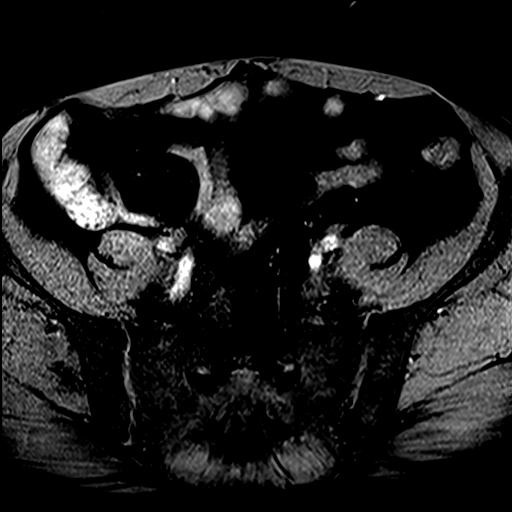

[Series 8: T1 dynamic fat-sat · axial · 3.0mm · 0.47mm/px · z∈[-197,+40]mm · 7 of 80 slices shown (2 of 2)]
[im 1/80]
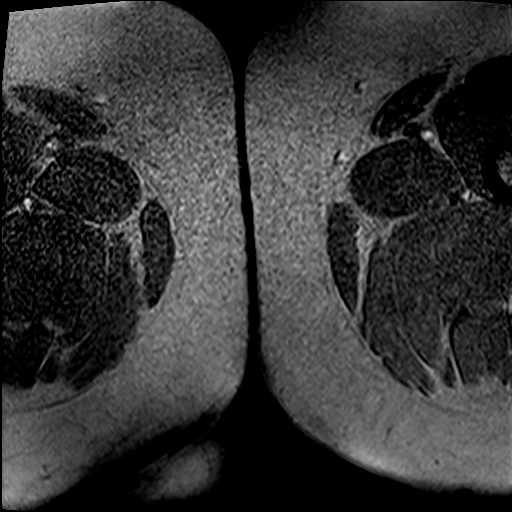
[im 14/80]
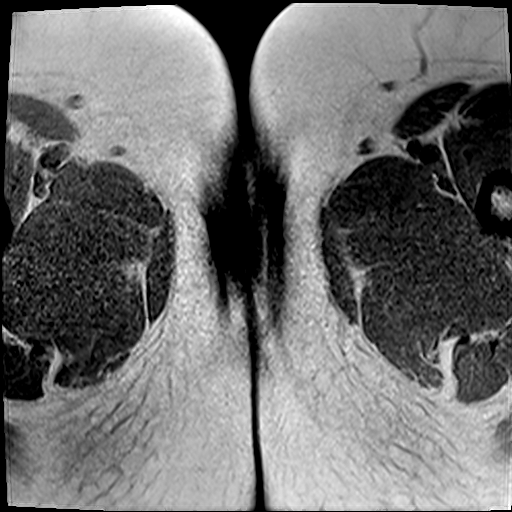
[im 27/80]
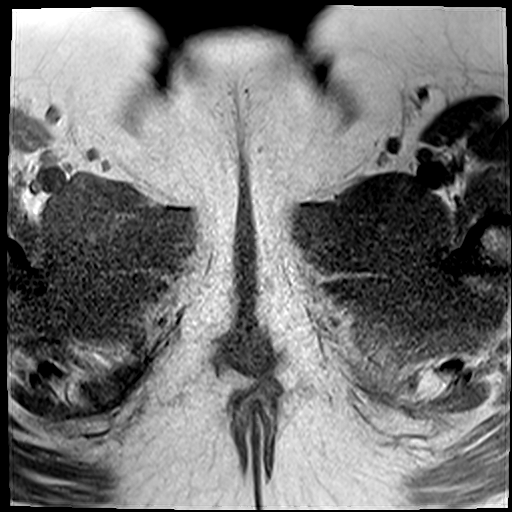
[im 40/80]
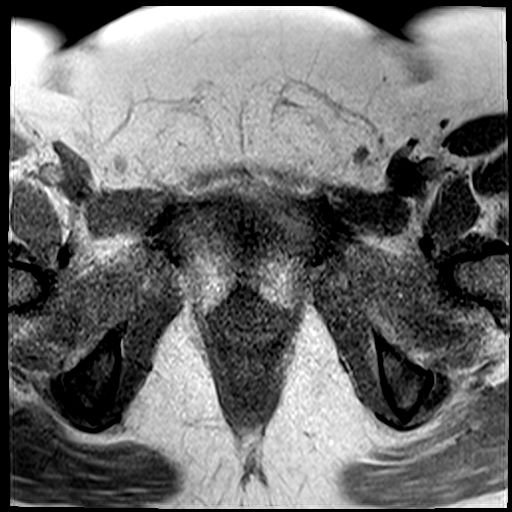
[im 53/80]
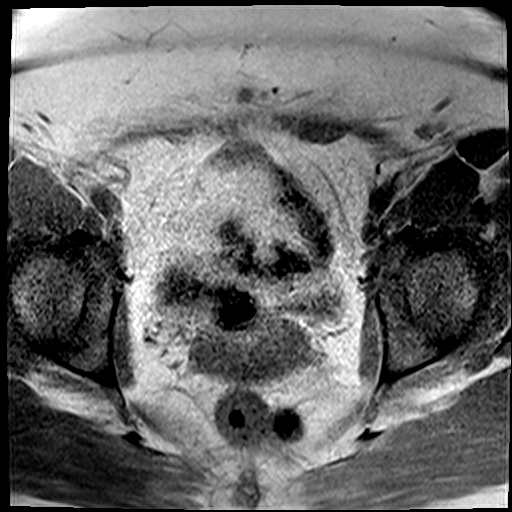
[im 66/80]
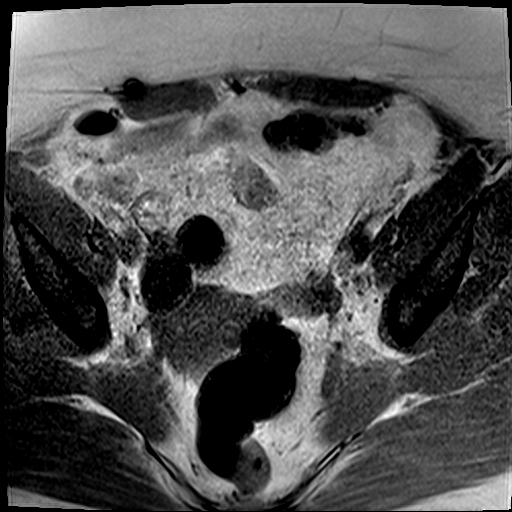
[im 80/80]
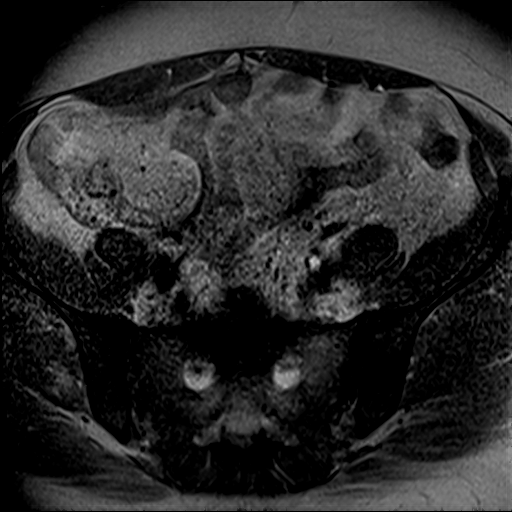

[26 of 48 positions shown; findings below may reference images not displayed]

FINDINGS: Urinary Tract: Distal ureters and bladder normal.

Bowel: Limited view of the small bowel, colon, and rectum are
unremarkable.

Vascular/Lymphatic: No pelvic lymphadenopathy. No vascular anomaly.

Reproductive:

Uterus: Measures 5.8 cm x 5.6 cm by 5.2 cm. Endometrial canal is is
normal at 5 mm. Junctional zone is within normal limits. Subserosal
leiomyoma along the posterior wall measuring 2.1 cm.

Right ovary:

Normal at 2.9 x 2.1 by 3.3 cm. Dominant follicle in the RIGHT ovary
measuring 2.2 cm.

Left ovary:  2.0 by 1.4 by 2.3 cm.  Normal small follicles.

Other: Normal the vagina.

In the posterior LEFT vulvar tissue, subtle linear hypodensity on T2
weighted imaging measuring 4 mm x 7 mm in axial dimension (image
33/3) and 15 mm in length (coronal image [DATE]). There is no
inflammation associated with this lesion. Linear tract like
enhancement (image 61/10) is present (image 30/15). This is
presumably the residual of the treated infected Bartholin's cysts.
No worrisome features

Musculoskeletal:  No aggressive osseous lesion
IMPRESSION: .

1. Subtle track like lesion in the posterior LEFT introitus is
presumably the residua of treated infected Bartholin's cyst.
Enhancement of this track is favored post infectious granulation. No
signs of active infection remain. No suspicious findings. If patient
has continued symptomology, consider follow-up MRI in 1-3 months to
evaluate persistence of this tract.
2. No pelvic lymphadenopathy.
3. Normal uterus and ovaries.

## 2020-09-04 ENCOUNTER — Ambulatory Visit (INDEPENDENT_AMBULATORY_CARE_PROVIDER_SITE_OTHER): Payer: 59 | Admitting: Obstetrics and Gynecology

## 2020-09-04 ENCOUNTER — Encounter: Payer: Self-pay | Admitting: Obstetrics and Gynecology

## 2020-09-04 ENCOUNTER — Other Ambulatory Visit: Payer: Self-pay

## 2020-09-04 VITALS — BP 144/90 | Ht 68.0 in | Wt 281.0 lb

## 2020-09-04 DIAGNOSIS — N760 Acute vaginitis: Secondary | ICD-10-CM

## 2020-09-04 LAB — POCT WET PREP WITH KOH
Clue Cells Wet Prep HPF POC: NEGATIVE
KOH Prep POC: NEGATIVE
Trichomonas, UA: NEGATIVE
Yeast Wet Prep HPF POC: NEGATIVE

## 2020-09-04 MED ORDER — FLUCONAZOLE 150 MG PO TABS: 150.0000 mg | ORAL_TABLET | Freq: Once | ORAL | 0 refills | Status: AC

## 2020-09-04 MED ORDER — CLOTRIMAZOLE-BETAMETHASONE 1-0.05 % EX CREA: TOPICAL_CREAM | CUTANEOUS | 0 refills | Status: DC

## 2020-09-04 NOTE — Progress Notes (Signed)
Patient, No Pcp Per (Inactive)   Chief Complaint  Patient presents with   Vaginal Itching    Irritation, fishy odor, no discharge on/off x few weeks    HPI:      [name] is a 50 y.o. [id number] whose LMP was No LMP recorded (lmp unknown). (Menstrual status: Irregular Periods)., presents today for vaginal itching and irritation, with dysuria, vaginal swelling, odor, off and on for a few wks. Has treated with leftover nystatin crm with some relief. No prior abx use, no change in soaps but uses body washes. No damp underwear/bathing suit. Has had pain/bleeding with sex since sx started. No LBP, pelvic pain, fevers. Hx of Bartholins cyst.    Past Medical History:  Diagnosis Date   Bartholin cyst    Migraines    migraines, sinus, stress    Past Surgical History:  Procedure Laterality Date   APPENDECTOMY     CESAREAN SECTION     IMAGE GUIDED SINUS SURGERY N/A 03/20/2016   Procedure: IMAGE GUIDED SINUS SURGERY;  Surgeon: Linus Salmons, MD;  Location: [facility];  Service: ENT;  Laterality: N/A;  STRYKER GAVE DISK TO [facility] 12-22 KP sent 2nd disk gave to cece 1-3 kp   LASIK     MAXILLARY ANTROSTOMY Bilateral 03/20/2016   Procedure: MAXILLARY ANTROSTOMY WITH TISSUE REMOVAL;  Surgeon: Linus Salmons, MD;  Location: [facility];  Service: ENT;  Laterality: Bilateral;   SEPTOPLASTY N/A 03/20/2016   Procedure: SEPTOPLASTY;  Surgeon: Linus Salmons, MD;  Location: [facility];  Service: ENT;  Laterality: N/A;   TONSILLECTOMY     TURBINATE REDUCTION Bilateral 03/20/2016   Procedure: TURBINATE REDUCTION;  Surgeon: Linus Salmons, MD;  Location: [facility];  Service: ENT;  Laterality: Bilateral;    History reviewed. No pertinent family history.  Social History   Socioeconomic History   Marital status: Married    Spouse name: Not on file   Number of children: Not on file   Years of education: Not on file   Highest education level: Not on  file  Occupational History   Not on file  Tobacco Use   Smoking status: Never   Smokeless tobacco: Never  Vaping Use   Vaping Use: Never used  Substance and Sexual Activity   Alcohol use: Yes    Comment: 1x/month   Drug use: Never   Sexual activity: Yes    Birth control/protection: None  Other Topics Concern   Not on file  Social History Narrative   Not on file   Social Determinants of Health   Financial Resource Strain: Not on file  Food Insecurity: Not on file  Transportation Needs: Not on file  Physical Activity: Not on file  Stress: Not on file  Social Connections: Not on file  Intimate Partner Violence: Not on file    Outpatient Medications Prior to Visit  Medication Sig Dispense Refill   acetaminophen (TYLENOL) 500 MG tablet Take 2 tablets (1,000 mg total) by mouth every 6 (six) hours as needed for moderate pain, fever or headache. 30 tablet 0   ALLEGRA-D ALLERGY & CONGESTION 60-120 MG 12 hr tablet Take 1 tablet by mouth 2 (two) times daily.     almotriptan (AXERT) 12.5 MG tablet Take 12.5 mg by mouth as needed for migraine. may repeat in 2 hours if needed     cetirizine (ZYRTEC) 10 MG tablet Take 10 mg by mouth daily.     Cholecalciferol (VITAMIN D-1000 MAX ST  PO) Take by mouth daily.     cyclobenzaprine (FLEXERIL) 10 MG tablet Take 10 mg by mouth as needed (tension headaches).     FLUoxetine (PROZAC) 20 MG capsule Take 40 mg by mouth every morning.     fluticasone (FLONASE) 50 MCG/ACT nasal spray fluticasone propionate 50 mcg/actuation nasal spray,suspension     ibuprofen (ADVIL) 200 MG tablet Take by mouth.     Multiple Vitamin (ONE DAILY) tablet Take by mouth.     promethazine (PHENERGAN) 12.5 MG tablet Take by mouth.     topiramate (TOPAMAX) 25 MG tablet Take 25 mg by mouth daily.     topiramate (TOPAMAX) 50 MG tablet Take by mouth.     brompheniramine-pseudoephedrine-DM 30-2-10 MG/5ML syrup Take 10 mLs by mouth 4 (four) times daily as needed. 120 mL 0    dexlansoprazole (DEXILANT) 60 MG capsule Take 1 capsule (60 mg total) by mouth daily. 30 capsule 6   FLUoxetine (PROZAC) 40 MG capsule Take 40 mg by mouth daily.     nystatin cream (MYCOSTATIN) Apply 1 application topically 2 (two) times daily. 30 g 0   omeprazole (PRILOSEC) 40 MG capsule Take 40 mg by mouth daily.     phentermine (ADIPEX-P) 37.5 MG tablet Take 1 tablet (37.5 mg total) by mouth daily before breakfast. 30 tablet 0   promethazine (PHENERGAN) 12.5 MG tablet Take by mouth.     topiramate (TOPAMAX) 50 MG tablet Take 50 mg by mouth 2 (two) times daily.     No facility-administered medications prior to visit.      ROS:  Review of Systems  Constitutional:  Negative for fever.  Gastrointestinal:  Negative for blood in stool, constipation, diarrhea, nausea and vomiting.  Genitourinary:  Positive for dyspareunia, dysuria and vaginal pain. Negative for flank pain, frequency, hematuria, urgency, vaginal bleeding and vaginal discharge.  Musculoskeletal:  Negative for back pain.  Skin:  Negative for rash.  BREAST: No symptoms   OBJECTIVE:   Vitals:  BP (!) 144/90   Ht 5\' 8"  (1.727 m)   Wt 281 lb (127.5 kg)   LMP  (LMP Unknown)   BMI 42.73 kg/m   Physical Exam Vitals reviewed.  Constitutional:      Appearance: She is well-developed.  Pulmonary:     Effort: Pulmonary effort is normal.  Genitourinary:    Pubic Area: No rash.      Labia:        Right: Rash and tenderness present. No lesion.        Left: Rash and tenderness present. No lesion.      Vagina: Normal. No vaginal discharge, erythema or tenderness.     Cervix: Normal.     Uterus: Normal. Not enlarged and not tender.      Adnexa: Right adnexa normal and left adnexa normal.       Right: No mass or tenderness.         Left: No mass or tenderness.         Comments: BILAT LABIA MAJORA AND MINORA WITH SWELLING, ERYTHEMA, SKIN BREAKDOWN; FISSURE NEAR MONS; NO ULCERATIVE LESIONS Musculoskeletal:        General:  Normal range of motion.     Cervical back: Normal range of motion.  Skin:    General: Skin is warm and dry.  Neurological:     General: No focal deficit present.     Mental Status: She is alert and oriented to person, place, and time.  Psychiatric:  Mood and Affect: Mood normal.        Behavior: Behavior normal.        Thought Content: Thought content normal.        Judgment: Judgment normal.    Results: Results for orders placed or performed in visit on 09/04/20 (from the past 24 hour(s))  POCT Wet Prep with KOH     Status: Normal   Collection Time: 09/04/20 11:16 AM  Result Value Ref Range   Trichomonas, UA Negative    Clue Cells Wet Prep HPF POC neg    Epithelial Wet Prep HPF POC     Yeast Wet Prep HPF POC neg    Bacteria Wet Prep HPF POC     RBC Wet Prep HPF POC     WBC Wet Prep HPF POC     KOH Prep POC Negative Negative     Assessment/Plan: Acute vaginitis - Plan: fluconazole (DIFLUCAN) 150 MG tablet, clotrimazole-betamethasone (LOTRISONE) cream, POCT Wet Prep with KOH; neg wet prep, pos exam and sx. Rx diflucan and lotrisone crm. Keep dry, change to Dove sens skin soap. F/u in 2 wks if sx persist to rule out LS.    Meds ordered this encounter  Medications   fluconazole (DIFLUCAN) 150 MG tablet    Sig: Take 1 tablet (150 mg total) by mouth once for 1 dose.    Dispense:  1 tablet    Refill:  0    Order Specific Question:   Supervising Provider    Answer:   Nadara Mustard [[id number]]   clotrimazole-betamethasone (LOTRISONE) cream    Sig: Apply externally BID for 2 wks    Dispense:  45 g    Refill:  0    Order Specific Question:   Supervising Provider    Answer:   Nadara Mustard [[id number]]       Return if symptoms worsen or fail to improve.  Tariah Transue B. Ivania Teagarden, PA-C 09/04/2020 11:18 AM

## 2020-09-18 ENCOUNTER — Telehealth: Payer: Self-pay

## 2020-09-18 NOTE — Telephone Encounter (Signed)
Pt needs f/u appt. See if she can come Fri this wk (since I'm out of town next wk). Thx.

## 2020-09-18 NOTE — Telephone Encounter (Signed)
Pt calling; was seen 2wks ago; is still having some pain and problems c fungal inf.  [phone]  Pt states she is better than she was but it's not completely gone.  Do you want her to try something else or be seen.  Pt aware note said to be seen if fails to improve or worsens.

## 2020-09-19 NOTE — Progress Notes (Signed)
Patient, No Pcp Per (Inactive)   Chief Complaint  Patient presents with   Follow-up    Little itchiness and spot that is tender, wants to make sure its getting better     HPI:      [name] is a 50 y.o. [id number] whose LMP was No LMP recorded (lmp unknown). (Menstrual status: Irregular Periods)., presents today for acute vaginitis f/u from 6/22. Had neg wet prep but pos exam with erythema, swelling, fissures; supsect for LS. Treated with diflucan and lotrisone crm for 2 wks but still has irritated area post fourchette, aggravated by sex earlier this wk. No increased d/c, odor.   Past Medical History:  Diagnosis Date   Bartholin cyst    Migraines    migraines, sinus, stress    Past Surgical History:  Procedure Laterality Date   APPENDECTOMY     CESAREAN SECTION     IMAGE GUIDED SINUS SURGERY N/A 03/20/2016   Procedure: IMAGE GUIDED SINUS SURGERY;  Surgeon: Linus Salmons, MD;  Location: [facility];  Service: ENT;  Laterality: N/A;  STRYKER GAVE DISK TO CECE 12-22 KP sent 2nd disk gave to cece 1-3 kp   LASIK     MAXILLARY ANTROSTOMY Bilateral 03/20/2016   Procedure: MAXILLARY ANTROSTOMY WITH TISSUE REMOVAL;  Surgeon: Linus Salmons, MD;  Location: [facility];  Service: ENT;  Laterality: Bilateral;   SEPTOPLASTY N/A 03/20/2016   Procedure: SEPTOPLASTY;  Surgeon: Linus Salmons, MD;  Location: [facility];  Service: ENT;  Laterality: N/A;   TONSILLECTOMY     TURBINATE REDUCTION Bilateral 03/20/2016   Procedure: TURBINATE REDUCTION;  Surgeon: Linus Salmons, MD;  Location: [facility];  Service: ENT;  Laterality: Bilateral;    History reviewed. No pertinent family history.  Social History   Socioeconomic History   Marital status: Married    Spouse name: Not on file   Number of children: Not on file   Years of education: Not on file   Highest education level: Not on file  Occupational History   Not on file  Tobacco Use    Smoking status: Never   Smokeless tobacco: Never  Vaping Use   Vaping Use: Never used  Substance and Sexual Activity   Alcohol use: Yes    Comment: 1x/month   Drug use: Never   Sexual activity: Yes    Birth control/protection: None  Other Topics Concern   Not on file  Social History Narrative   Not on file   Social Determinants of Health   Financial Resource Strain: Not on file  Food Insecurity: Not on file  Transportation Needs: Not on file  Physical Activity: Not on file  Stress: Not on file  Social Connections: Not on file  Intimate Partner Violence: Not on file    Outpatient Medications Prior to Visit  Medication Sig Dispense Refill   acetaminophen (TYLENOL) 500 MG tablet Take 2 tablets (1,000 mg total) by mouth every 6 (six) hours as needed for moderate pain, fever or headache. 30 tablet 0   ALLEGRA-D ALLERGY & CONGESTION 60-120 MG 12 hr tablet Take 1 tablet by mouth 2 (two) times daily.     almotriptan (AXERT) 12.5 MG tablet Take 12.5 mg by mouth as needed for migraine. may repeat in 2 hours if needed     cetirizine (ZYRTEC) 10 MG tablet Take 10 mg by mouth daily.     Cholecalciferol (VITAMIN D-1000 MAX ST PO) Take by mouth daily.  clotrimazole-betamethasone (LOTRISONE) cream Apply externally BID for 2 wks 45 g 0   cyclobenzaprine (FLEXERIL) 10 MG tablet Take 10 mg by mouth as needed (tension headaches).     FLUoxetine (PROZAC) 20 MG capsule Take 40 mg by mouth every morning.     fluticasone (FLONASE) 50 MCG/ACT nasal spray fluticasone propionate 50 mcg/actuation nasal spray,suspension     ibuprofen (ADVIL) 200 MG tablet Take by mouth.     Multiple Vitamin (ONE DAILY) tablet Take by mouth.     topiramate (TOPAMAX) 25 MG tablet Take 25 mg by mouth daily.     topiramate (TOPAMAX) 50 MG tablet Take by mouth.     promethazine (PHENERGAN) 12.5 MG tablet Take by mouth.     No facility-administered medications prior to visit.      ROS:  Review of Systems   Constitutional:  Negative for fever.  Gastrointestinal:  Negative for blood in stool, constipation, diarrhea, nausea and vomiting.  Genitourinary:  Positive for vaginal pain. Negative for dyspareunia, dysuria, flank pain, frequency, hematuria, urgency, vaginal bleeding and vaginal discharge.  Musculoskeletal:  Negative for back pain.  Skin:  Negative for rash.  BREAST: No symptoms   OBJECTIVE:   Vitals:  BP 124/90   Ht 5\' 7"  (1.702 m)   Wt 281 lb (127.5 kg)   LMP  (LMP Unknown)   BMI 44.01 kg/m   Physical Exam Constitutional:      Appearance: Normal appearance.  Pulmonary:     Effort: Pulmonary effort is normal.  Genitourinary:    Labia:        Right: No rash, tenderness or lesion.        Left: No rash, tenderness or lesion.     Musculoskeletal:        General: Normal range of motion.  Neurological:     Mental Status: She is alert and oriented to person, place, and time.  Psychiatric:        Judgment: Judgment normal.   VULVAR BIOPSY NOTE The indications for vulvar biopsy (rule out neoplasia, establish lichen sclerosus diagnosis) were reviewed.   Risks of the biopsy including pain, bleeding, infection, inadequate specimen, scarring and need for additional procedures  were discussed. The patient stated understanding and agreed to undergo procedure today. Consent was signed,  time out performed.   The patient's vulva was prepped with Betadine. 1% lidocaine was injected into area of concern. A 3 -mm punch biopsy was done, biopsy tissue was picked up with sterile forceps and sterile scissors were used to excise the lesion.  Small bleeding was noted and hemostasis was achieved using silver nitrate sticks.  The patient tolerated the procedure well. Post-procedure instructions  (pelvic rest for one week) were given to the patient. The patient is to call with heavy bleeding, fever greater than 100.4, foul smelling vaginal discharge or other concerns.    Assessment/Plan: Vaginal  irritation - Plan: Surgical pathology; treated with diflucan and lotrisone crm for 2 wks but exam suspicious for LS. Vulvar bx today. Will f/u with results. If pos, will start clobetasol.     Return if symptoms worsen or fail to improve.  Shade Rivenbark B. Jolean Madariaga, PA-C 09/20/2020 10:38 AM

## 2020-09-20 ENCOUNTER — Other Ambulatory Visit: Payer: Self-pay

## 2020-09-20 ENCOUNTER — Ambulatory Visit (INDEPENDENT_AMBULATORY_CARE_PROVIDER_SITE_OTHER): Payer: 59 | Admitting: Obstetrics and Gynecology

## 2020-09-20 ENCOUNTER — Encounter: Payer: Self-pay | Admitting: Obstetrics and Gynecology

## 2020-09-20 ENCOUNTER — Other Ambulatory Visit (HOSPITAL_COMMUNITY)
Admission: RE | Admit: 2020-09-20 | Discharge: 2020-09-20 | Disposition: A | Discharge status: Home / Self Care | Payer: 59 | Source: Ambulatory Visit | Attending: Obstetrics and Gynecology | Admitting: Obstetrics and Gynecology

## 2020-09-20 VITALS — BP 124/90 | Ht 67.0 in | Wt 281.0 lb

## 2020-09-20 DIAGNOSIS — N898 Other specified noninflammatory disorders of vagina: Secondary | ICD-10-CM | POA: Insufficient documentation

## 2020-09-20 NOTE — Patient Instructions (Signed)
I value your feedback and you entrusting us with your care. If you get a [place] patient survey, I would appreciate you taking the time to let us know about your experience today. Thank you! ? ? ?

## 2020-09-24 ENCOUNTER — Encounter: Payer: Self-pay | Admitting: Obstetrics and Gynecology

## 2020-09-24 LAB — SURGICAL PATHOLOGY

## 2020-09-24 MED ORDER — CLOBETASOL PROPIONATE 0.05 % EX OINT: TOPICAL_OINTMENT | CUTANEOUS | 1 refills | Status: DC

## 2020-09-24 NOTE — Addendum Note (Signed)
Addended by: Althea Grimmer B on: 09/24/2020 02:03 PM   Modules accepted: Orders

## 2021-05-13 ENCOUNTER — Other Ambulatory Visit: Payer: Self-pay

## 2021-05-13 ENCOUNTER — Encounter: Payer: Self-pay | Admitting: Obstetrics and Gynecology

## 2021-05-13 ENCOUNTER — Ambulatory Visit (INDEPENDENT_AMBULATORY_CARE_PROVIDER_SITE_OTHER): Payer: Self-pay | Admitting: Obstetrics and Gynecology

## 2021-05-13 VITALS — BP 144/90 | Ht 67.0 in | Wt 287.0 lb

## 2021-05-13 DIAGNOSIS — L02411 Cutaneous abscess of right axilla: Secondary | ICD-10-CM

## 2021-05-13 DIAGNOSIS — Z1231 Encounter for screening mammogram for malignant neoplasm of breast: Secondary | ICD-10-CM

## 2021-05-13 NOTE — Progress Notes (Signed)
Patient, No Pcp Per (Inactive)   Chief Complaint  Patient presents with   Exam    Lump on right side underarm, tender x 1 day    HPI:      [name] is a 51 y.o. [id number] whose LMP was Patient's last menstrual period was 02/27/2021 (approximate)., presents today for RT axillary lesion and tenderness since yesterday. Area is red, no d/c. Started warm compresses last night with some pain improvement. Had shaved before sx started. No fevers/chills. No breast masses/sx. Neg mammo 02/02/20 at [facility]; would like mammo order.  Past due for annual.   Patient Active Problem List   Diagnosis Date Noted   Migraine 01/29/2017   Vitamin D deficiency 01/29/2017   Abnormal gait 10/01/2016   Muscle weakness 10/01/2016   Foot pain 09/22/2016   Peroneal tendinitis 09/22/2016   Sprain of deltoid ligament of ankle 09/22/2016   Sprain of foot 09/22/2016   Morbid obesity with BMI of 40.0-44.9, adult (HCC) 12/14/2015   Intractable migraine without aura and without status migrainosus 12/12/2015    Past Surgical History:  Procedure Laterality Date   APPENDECTOMY     CESAREAN SECTION     IMAGE GUIDED SINUS SURGERY N/A 03/20/2016   Procedure: IMAGE GUIDED SINUS SURGERY;  Surgeon: Linus Salmons, MD;  Location: [facility];  Service: ENT;  Laterality: N/A;  STRYKER GAVE DISK TO CECE 12-22 KP sent 2nd disk gave to cece 1-3 kp   LASIK     MAXILLARY ANTROSTOMY Bilateral 03/20/2016   Procedure: MAXILLARY ANTROSTOMY WITH TISSUE REMOVAL;  Surgeon: Linus Salmons, MD;  Location: [facility];  Service: ENT;  Laterality: Bilateral;   SEPTOPLASTY N/A 03/20/2016   Procedure: SEPTOPLASTY;  Surgeon: Linus Salmons, MD;  Location: [facility];  Service: ENT;  Laterality: N/A;   TONSILLECTOMY     TURBINATE REDUCTION Bilateral 03/20/2016   Procedure: TURBINATE REDUCTION;  Surgeon: Linus Salmons, MD;  Location: [facility];  Service: ENT;  Laterality: Bilateral;    History  reviewed. No pertinent family history.  Social History   Socioeconomic History   Marital status: Married    Spouse name: Not on file   Number of children: Not on file   Years of education: Not on file   Highest education level: Not on file  Occupational History   Not on file  Tobacco Use   Smoking status: Never   Smokeless tobacco: Never  Vaping Use   Vaping Use: Never used  Substance and Sexual Activity   Alcohol use: Yes    Comment: 1x/month   Drug use: Never   Sexual activity: Yes    Birth control/protection: None  Other Topics Concern   Not on file  Social History Narrative   Not on file   Social Determinants of Health   Financial Resource Strain: Not on file  Food Insecurity: Not on file  Transportation Needs: Not on file  Physical Activity: Not on file  Stress: Not on file  Social Connections: Not on file  Intimate Partner Violence: Not on file    Outpatient Medications Prior to Visit  Medication Sig Dispense Refill   acetaminophen (TYLENOL) 500 MG tablet Take 2 tablets (1,000 mg total) by mouth every 6 (six) hours as needed for moderate pain, fever or headache. 30 tablet 0   ALLEGRA-D ALLERGY & CONGESTION 60-120 MG 12 hr tablet Take 1 tablet by mouth 2 (two) times daily.     almotriptan (AXERT) 12.5 MG tablet Take  12.5 mg by mouth as needed for migraine. may repeat in 2 hours if needed     Cholecalciferol (VITAMIN D-1000 MAX ST PO) Take by mouth daily.     clobetasol ointment (TEMOVATE) 0.05 % Apply to affected area every night for 4 weeks, then every other night for 4 weeks and then twice a week for 4 weeks or until resolution. 45 g 1   cyclobenzaprine (FLEXERIL) 10 MG tablet Take 10 mg by mouth as needed (tension headaches).     FLUoxetine (PROZAC) 20 MG capsule Take 40 mg by mouth every morning.     fluticasone (FLONASE) 50 MCG/ACT nasal spray fluticasone propionate 50 mcg/actuation nasal spray,suspension     ibuprofen (ADVIL) 200 MG tablet Take by mouth.      Multiple Vitamin (ONE DAILY) tablet Take by mouth.     topiramate (TOPAMAX) 25 MG tablet Take 25 mg by mouth daily.     topiramate (TOPAMAX) 50 MG tablet Take by mouth.     promethazine (PHENERGAN) 12.5 MG tablet Take by mouth.     cetirizine (ZYRTEC) 10 MG tablet Take 10 mg by mouth daily.     clotrimazole-betamethasone (LOTRISONE) cream Apply externally BID for 2 wks 45 g 0   No facility-administered medications prior to visit.      ROS:  Review of Systems  Constitutional:  Negative for fever.  Gastrointestinal:  Negative for blood in stool, constipation, diarrhea, nausea and vomiting.  Genitourinary:  Negative for dyspareunia, dysuria, flank pain, frequency, hematuria, urgency, vaginal bleeding, vaginal discharge and vaginal pain.  Musculoskeletal:  Negative for back pain.  Skin:  Positive for color change. Negative for rash.  BREAST: No symptoms   OBJECTIVE:   Vitals:  BP (!) 144/90    Ht 5\' 7"  (1.702 m)    Wt 287 lb (130.2 kg)    LMP 02/27/2021 (Approximate)    BMI 44.95 kg/m   Physical Exam Constitutional:      Appearance: Normal appearance.  Pulmonary:     Effort: Pulmonary effort is normal.  Chest:    Musculoskeletal:        General: Normal range of motion.  Lymphadenopathy:     Upper Body:     Right upper body: No axillary adenopathy.  Neurological:     Mental Status: She is alert and oriented to person, place, and time.  Psychiatric:        Judgment: Judgment normal.    Assessment/Plan: Cutaneous abscess of right axilla--cont warm compresses, Rx abx not indicated right now. Pt to f/u if sx worsen for abx.   Encounter for screening mammogram for malignant neoplasm of breast - Plan: MM 3D SCREEN BREAST BILATERAL; pt to call to [facility] [place]   Return if symptoms worsen or fail to improve.  Lindie Roberson B. Henrik Orihuela, PA-C 05/13/2021 11:04 AM

## 2021-05-22 ENCOUNTER — Other Ambulatory Visit: Payer: Self-pay

## 2021-05-22 ENCOUNTER — Ambulatory Visit (LOCAL_COMMUNITY_HEALTH_CENTER): Payer: BC Managed Care – PPO

## 2021-05-22 DIAGNOSIS — Z23 Encounter for immunization: Secondary | ICD-10-CM

## 2021-05-22 DIAGNOSIS — Z719 Counseling, unspecified: Secondary | ICD-10-CM

## 2021-05-22 NOTE — Progress Notes (Signed)
Pt is here for MMR vaccine needed for work. Pt stated that MMR titer was done 05/20/21 in [place] [facility], and that her mumps results was <9.0. Nurse verified mmr titer from [facility], mumps showing <9.0 on 05/20/21. Pt given MMR vaccine to (R) posterior upper arm SQ, tolerated well. Copy of updated [facility] given to pt. Clent Ridges, LPN. ?

## 2021-06-19 ENCOUNTER — Ambulatory Visit
Admission: RE | Admit: 2021-06-19 | Discharge: 2021-06-19 | Disposition: A | Discharge status: Home / Self Care | Payer: 59 | Source: Ambulatory Visit | Attending: Obstetrics and Gynecology | Admitting: Obstetrics and Gynecology

## 2021-06-19 DIAGNOSIS — Z1231 Encounter for screening mammogram for malignant neoplasm of breast: Secondary | ICD-10-CM | POA: Diagnosis present

## 2021-06-27 ENCOUNTER — Ambulatory Visit (LOCAL_COMMUNITY_HEALTH_CENTER): Payer: BC Managed Care – PPO

## 2021-06-27 DIAGNOSIS — Z23 Encounter for immunization: Secondary | ICD-10-CM

## 2021-06-27 DIAGNOSIS — Z719 Counseling, unspecified: Secondary | ICD-10-CM

## 2021-06-27 NOTE — Progress Notes (Signed)
Patient needed 2nd dose of MMR for job.  MMR II administered subcutaneously in right arm. Tolerated well. [facility] updated and copy provided to patient. ?

## 2021-08-13 ENCOUNTER — Ambulatory Visit: Payer: BC Managed Care – PPO | Admitting: Licensed Practical Nurse

## 2021-08-13 ENCOUNTER — Other Ambulatory Visit (HOSPITAL_COMMUNITY)
Admission: RE | Admit: 2021-08-13 | Discharge: 2021-08-13 | Disposition: A | Discharge status: Home / Self Care | Payer: BC Managed Care – PPO | Source: Ambulatory Visit | Attending: Licensed Practical Nurse | Admitting: Licensed Practical Nurse

## 2021-08-13 VITALS — BP 126/80 | Ht 67.0 in | Wt 289.0 lb

## 2021-08-13 DIAGNOSIS — Z3009 Encounter for other general counseling and advice on contraception: Secondary | ICD-10-CM | POA: Diagnosis not present

## 2021-08-13 DIAGNOSIS — Z113 Encounter for screening for infections with a predominantly sexual mode of transmission: Secondary | ICD-10-CM

## 2021-08-13 NOTE — Progress Notes (Signed)
Obstetrics & Gynecology Office Visit   Chief Complaint:  Chief Complaint  Patient presents with   Contraception    History of Present Illness: here to discuss contraception options.  Sheniece is sexually active with her husband recently they have opened their sexual relationship to other partners. She has had 5 other female sexual partners. Given her increase in sexual activity and that she does get periods she is here today to discuss her contraceptive options. She also desires STI screening.  Denies smoking, denies CHTN,  has a hx of Migraines, BMI 22, is 51 years old Menstrual history: cycles are irregular, occur every 1 to 3 months, they last 5 days and are "light"  LMP April  Review of Systems see above   Past Medical History:  Past Medical History:  Diagnosis Date   Bartholin cyst    Migraines    migraines, sinus, stress    Past Surgical History:  Past Surgical History:  Procedure Laterality Date   APPENDECTOMY     CESAREAN SECTION     IMAGE GUIDED SINUS SURGERY N/A 03/20/2016   Procedure: IMAGE GUIDED SINUS SURGERY;  Surgeon: Beverly Gust, MD;  Location: [place];  Service: ENT;  Laterality: N/A;  STRYKER GAVE DISK TO CECE 12-22 KP sent 2nd disk gave to cece 1-3 kp   LASIK     MAXILLARY ANTROSTOMY Bilateral 03/20/2016   Procedure: MAXILLARY ANTROSTOMY WITH TISSUE REMOVAL;  Surgeon: Beverly Gust, MD;  Location: [place];  Service: ENT;  Laterality: Bilateral;   SEPTOPLASTY N/A 03/20/2016   Procedure: SEPTOPLASTY;  Surgeon: Beverly Gust, MD;  Location: [place];  Service: ENT;  Laterality: N/A;   TONSILLECTOMY     TURBINATE REDUCTION Bilateral 03/20/2016   Procedure: TURBINATE REDUCTION;  Surgeon: Beverly Gust, MD;  Location: [place];  Service: ENT;  Laterality: Bilateral;    Gynecologic History: Patient's last menstrual period was 06/28/2021 (approximate).  Obstetric History: [id number]  Family History:  Family  History  Problem Relation Age of Onset   Breast cancer Neg Hx     Social History:  Social History   Socioeconomic History   Marital status: Married    Spouse name: Not on file   Number of children: Not on file   Years of education: Not on file   Highest education level: Not on file  Occupational History   Not on file  Tobacco Use   Smoking status: Never   Smokeless tobacco: Never  Vaping Use   Vaping Use: Never used  Substance and Sexual Activity   Alcohol use: Yes    Comment: 1x/month   Drug use: Never   Sexual activity: Yes    Birth control/protection: None  Other Topics Concern   Not on file  Social History Narrative   Not on file   Social Determinants of Health   Financial Resource Strain: Not on file  Food Insecurity: Not on file  Transportation Needs: Not on file  Physical Activity: Not on file  Stress: Not on file  Social Connections: Not on file  Intimate Partner Violence: Not on file    Allergies:  No Known Allergies  Medications: Prior to Admission medications   Medication Sig Start Date End Date Taking? Authorizing Provider  acetaminophen (TYLENOL) 500 MG tablet Take 2 tablets (1,000 mg total) by mouth every 6 (six) hours as needed for moderate pain, fever or headache. 01/11/15  Yes Betancourt, Aura Fey, NP  ALLEGRA-D ALLERGY & CONGESTION 60-120 MG 12  hr tablet Take 1 tablet by mouth 2 (two) times daily. 04/22/20  Yes [provider]  almotriptan (AXERT) 12.5 MG tablet Take 12.5 mg by mouth as needed for migraine. may repeat in 2 hours if needed   Yes [provider]  Cholecalciferol (VITAMIN D-1000 MAX ST PO) Take by mouth daily.   Yes [provider]  clobetasol ointment (TEMOVATE) 0.05 % Apply to affected area every night for 4 weeks, then every other night for 4 weeks and then twice a week for 4 weeks or until resolution. [id number]  Yes Copland, Deirdre Evener, PA-C  cyclobenzaprine (FLEXERIL) 10 MG tablet Take 10 mg by mouth as  needed (tension headaches).   Yes [provider]  FLUoxetine (PROZAC) 20 MG capsule Take 40 mg by mouth every morning. 09/01/20  Yes [provider]  fluticasone (FLONASE) 50 MCG/ACT nasal spray fluticasone propionate 50 mcg/actuation nasal spray,suspension   Yes [provider]  ibuprofen (ADVIL) 200 MG tablet Take by mouth.   Yes [provider]  Multiple Vitamin (ONE DAILY) tablet Take by mouth.   Yes [provider]  topiramate (TOPAMAX) 25 MG tablet Take 25 mg by mouth daily. 09/01/20  Yes [provider]  topiramate (TOPAMAX) 50 MG tablet Take by mouth. 08/13/20 08/13/21 Yes [provider]  promethazine (PHENERGAN) 12.5 MG tablet Take by mouth. 08/13/20 09/12/20  [provider]    Physical Exam Vitals:  Vitals:   08/13/21 1454  BP: 126/80   Patient's last menstrual period was 06/28/2021 (approximate).  General: NAD Psychiatric: mood appropriate, affect full    Assessment: 51 y.o. [id number] contraceptive counseling   Plan: Problem List Items Addressed This Visit   None Visit Diagnoses     Screening examination for venereal disease    -  Primary   Relevant Orders   HEP, RPR, HIV Panel   Hepatitis C antibody   Cervicovaginal ancillary only      Information given for progesterone only methods and ParaGard IUD.   Will go home and review options and schedule appointment if IUD desired.   Roberto Scales, CNM  [place], [facility]  08/19/21  2:19 PM

## 2021-08-14 ENCOUNTER — Encounter: Payer: Self-pay | Admitting: Obstetrics

## 2021-08-14 ENCOUNTER — Ambulatory Visit: Payer: BC Managed Care – PPO | Admitting: Obstetrics

## 2021-08-14 VITALS — BP 142/94 | Ht 67.0 in | Wt 288.8 lb

## 2021-08-14 DIAGNOSIS — Z30430 Encounter for insertion of intrauterine contraceptive device: Secondary | ICD-10-CM

## 2021-08-14 DIAGNOSIS — N924 Excessive bleeding in the premenopausal period: Secondary | ICD-10-CM

## 2021-08-14 LAB — CERVICOVAGINAL ANCILLARY ONLY
Chlamydia: NEGATIVE
Comment: NEGATIVE
Comment: NEGATIVE
Comment: NORMAL
Neisseria Gonorrhea: NEGATIVE
Trichomonas: NEGATIVE

## 2021-08-14 LAB — POCT URINE PREGNANCY: Preg Test, Ur: NEGATIVE

## 2021-08-14 MED ORDER — PARAGARD INTRAUTERINE COPPER IU IUD
1.0000 | INTRAUTERINE_SYSTEM | Freq: Once | INTRAUTERINE | Status: AC
  Administered 2021-08-14: 1 via INTRAUTERINE

## 2021-08-14 NOTE — Progress Notes (Signed)
Chief Complaint  Patient presents with   Contraception    Patient presents in office today for Paraguard placement.    Patient Judith Barrett is a 51 y.o. [id number] No LMP recorded (within weeks). (Menstrual status: Irregular Periods). who presents for ParaGard IUD insert.  Pap is up to date. Risks benefits and alternatives of ParaGard were discussed with patient. Patient is aware periods may be longer, heavier, spotting in between, or more crampy. Patient desires to proceed. Patient is in a polyamorous relationships and wants adequate non hormonal protection.   Period Duration (Days): 5 Period Pattern: (!) Irregular Menstrual Flow: Light, Moderate Dysmenorrhea: None  BP (!) 142/94   Ht 5\' 7"  (1.702 m)   Wt 288 lb 12.8 oz (131 kg)   LMP  (Within Weeks) Comment: Mid April  BMI 45.23 kg/m   Procedure Note: ParaGard IUD insertion  After speculum placed, Manipulator (allis/tenaculum) was applied to cervix.  Cervix was cleaned with betadine. Gentle downward traction applied.  Next endometrial cavity sounded to 9 cm using flexible curved sound.  Uterus noted to be in anteverted position using flexible sound.  IUD loaded into insertion instrument. Thereafter, IUD inserted in a single attempt without difficulty to the level of the fundus. Insertion instrument disengaged and removed in standard fashion. Manipulator removed and silver nitrate applied as needed.  Strings trimmed to approximate length of 3 cm. No apparent complications were noted. Patient left the office in stable condition.   Encounter for insertion of copper IUD - Plan: POCT urine pregnancy, paragard intrauterine copper IUD 1 each  Abnormal perimenopausal bleeding  We discussed menopausal transition and when to return for AUB  Return warnings for paragard discussed  Return in about 4 weeks (around 09/11/2021) for IUD check .

## 2021-08-14 NOTE — Patient Instructions (Signed)
The most common side effects of ParaGard are heavier and longer periods and spotting between periods. Call if you have a missed period, severe pain, temperature above 100.4, foul smelling discharge, or prolonged or heavy flow/spotting after 3 months.

## 2021-08-19 LAB — HEP, RPR, HIV PANEL
HIV Screen 4th Generation wRfx: NONREACTIVE
Hepatitis B Surface Ag: NEGATIVE
RPR Ser Ql: NONREACTIVE

## 2021-08-19 LAB — HEPATITIS C ANTIBODY: Hep C Virus Ab: NONREACTIVE

## 2021-09-09 ENCOUNTER — Ambulatory Visit: Payer: BC Managed Care – PPO | Admitting: Obstetrics & Gynecology

## 2021-09-09 ENCOUNTER — Ambulatory Visit: Payer: BC Managed Care – PPO | Admitting: Obstetrics

## 2021-09-09 ENCOUNTER — Encounter: Payer: Self-pay | Admitting: Obstetrics & Gynecology

## 2021-09-09 VITALS — BP 130/90 | Ht 67.0 in | Wt 293.0 lb

## 2021-09-09 DIAGNOSIS — Z30431 Encounter for routine checking of intrauterine contraceptive device: Secondary | ICD-10-CM

## 2021-09-09 DIAGNOSIS — Z1231 Encounter for screening mammogram for malignant neoplasm of breast: Secondary | ICD-10-CM

## 2022-01-06 ENCOUNTER — Other Ambulatory Visit: Payer: Self-pay | Admitting: *Deleted

## 2022-01-06 ENCOUNTER — Telehealth: Payer: Self-pay | Admitting: *Deleted

## 2022-01-06 DIAGNOSIS — Z1211 Encounter for screening for malignant neoplasm of colon: Secondary | ICD-10-CM

## 2022-01-06 MED ORDER — NA SULFATE-K SULFATE-MG SULF 17.5-3.13-1.6 GM/177ML PO SOLN: ORAL | 0 refills | Status: AC

## 2022-01-06 NOTE — Telephone Encounter (Signed)
Gastroenterology Pre-Procedure Review  Request Date: 01/30/2022 Requesting Physician: Dr. Allen Norris  PATIENT REVIEW QUESTIONS: The patient responded to the following health history questions as indicated:    1. Are you having any GI issues? no 2. Do you have a personal history of Polyps? no 3. Do you have a family history of Colon Cancer or Polyps? no 4. Diabetes Mellitus? no 5. Joint replacements in the past 12 months?no 6. Major health problems in the past 3 months?no 7. Any artificial heart valves, MVP, or defibrillator?no    MEDICATIONS & ALLERGIES:    Patient reports the following regarding taking any anticoagulation/antiplatelet therapy:   Plavix, Coumadin, Eliquis, Xarelto, Lovenox, Pradaxa, Brilinta, or Effient? no Aspirin? no  Patient confirms/reports the following medications:  Current Outpatient Medications  Medication Sig Dispense Refill   acetaminophen (TYLENOL) 500 MG tablet Take 2 tablets (1,000 mg total) by mouth every 6 (six) hours as needed for moderate pain, fever or headache. 30 tablet 0   ALLEGRA-D ALLERGY & CONGESTION 60-120 MG 12 hr tablet Take 1 tablet by mouth 2 (two) times daily.     almotriptan (AXERT) 12.5 MG tablet Take 12.5 mg by mouth as needed for migraine. may repeat in 2 hours if needed     Cholecalciferol (VITAMIN D-1000 MAX ST PO) Take by mouth daily.     clobetasol ointment (TEMOVATE) 0.05 % Apply to affected area every night for 4 weeks, then every other night for 4 weeks and then twice a week for 4 weeks or until resolution. 45 g 1   cyclobenzaprine (FLEXERIL) 10 MG tablet Take 10 mg by mouth as needed (tension headaches).     FLUoxetine (PROZAC) 20 MG capsule Take 40 mg by mouth every morning.     fluticasone (FLONASE) 50 MCG/ACT nasal spray fluticasone propionate 50 mcg/actuation nasal spray,suspension     ibuprofen (ADVIL) 200 MG tablet Take by mouth.     Multiple Vitamin (ONE DAILY) tablet Take by mouth.     promethazine (PHENERGAN) 12.5 MG  tablet Take by mouth.     topiramate (TOPAMAX) 25 MG tablet Take 25 mg by mouth daily.     topiramate (TOPAMAX) 50 MG tablet Take by mouth.     No current facility-administered medications for this visit.    Patient confirms/reports the following allergies:  No Known Allergies  No orders of the defined types were placed in this encounter.   AUTHORIZATION INFORMATION Primary Insurance: 1D#: Group #:  Secondary Insurance: 1D#: Group #:  SCHEDULE INFORMATION: Date: 01/30/2022 Time: Location: [facility]

## 2022-01-26 ENCOUNTER — Telehealth: Payer: Self-pay

## 2022-01-26 NOTE — Telephone Encounter (Signed)
Patient returned call to reschedule her colonoscopy. Colonoscopy has been rescheduled to 04/10/22 with Dr. Servando Snare at [facility]  Thanks,  [place], [place]

## 2022-01-26 NOTE — Telephone Encounter (Signed)
Patient lvm requesting to reschedule her colonoscopy currently scheduled at [facility] on 01/30/22 with Dr. Servando Snare.  Will await call back to reschedule patients procedure.  Thanks,  [place], [place]

## 2022-03-23 DIAGNOSIS — B338 Other specified viral diseases: Secondary | ICD-10-CM

## 2022-03-23 HISTORY — DX: Other specified viral diseases: B33.8

## 2022-03-31 ENCOUNTER — Encounter: Payer: Self-pay | Admitting: Anesthesiology

## 2022-03-31 ENCOUNTER — Other Ambulatory Visit: Payer: Self-pay

## 2022-03-31 ENCOUNTER — Encounter: Payer: Self-pay | Admitting: Gastroenterology

## 2022-04-08 ENCOUNTER — Telehealth: Payer: Self-pay | Admitting: *Deleted

## 2022-04-08 NOTE — Telephone Encounter (Signed)
Per Maudie Mercury at [facility], patient have RSV and have been reschedule to 04/24/2022.  New instructions will be sent to patient.

## 2022-04-14 ENCOUNTER — Ambulatory Visit
Admission: RE | Admit: 2022-04-14 | Discharge: 2022-04-14 | Disposition: A | Discharge status: Home / Self Care | Payer: BC Managed Care – PPO | Source: Ambulatory Visit | Attending: Family Medicine | Admitting: Family Medicine

## 2022-04-14 ENCOUNTER — Other Ambulatory Visit: Payer: Self-pay | Admitting: Family Medicine

## 2022-04-14 DIAGNOSIS — R112 Nausea with vomiting, unspecified: Secondary | ICD-10-CM | POA: Diagnosis present

## 2022-04-14 DIAGNOSIS — R197 Diarrhea, unspecified: Secondary | ICD-10-CM | POA: Diagnosis present

## 2022-04-14 DIAGNOSIS — R17 Unspecified jaundice: Secondary | ICD-10-CM | POA: Insufficient documentation

## 2022-04-14 DIAGNOSIS — R110 Nausea: Secondary | ICD-10-CM | POA: Diagnosis present

## 2022-04-14 MED ORDER — IOHEXOL 300 MG/ML  SOLN
100.0000 mL | Freq: Once | INTRAMUSCULAR | Status: AC | PRN
  Administered 2022-04-14: 100 mL via INTRAVENOUS

## 2022-04-14 NOTE — Progress Notes (Signed)
Contacted by CT scan person to contact IV team. Called IV team, no answer. Will call again in a few minutes.

## 2022-04-24 ENCOUNTER — Ambulatory Visit
Admission: RE | Admit: 2022-04-24 | Payer: BC Managed Care – PPO | Source: Home / Self Care | Admitting: Gastroenterology

## 2022-04-24 SURGERY — COLONOSCOPY WITH PROPOFOL
Anesthesia: Choice

## 2022-05-13 ENCOUNTER — Other Ambulatory Visit: Payer: Self-pay | Admitting: Obstetrics and Gynecology

## 2022-12-08 ENCOUNTER — Telehealth: Payer: Self-pay

## 2022-12-08 NOTE — Telephone Encounter (Signed)
Patient is calling requesting a refill on clobetasol ointment. She has not been seen in clinic since 2022. Please advise.

## 2022-12-08 NOTE — Telephone Encounter (Signed)
Left voicemail. We have clobetasol ointment on file. Requested return call to verify.

## 2022-12-08 NOTE — Telephone Encounter (Signed)
Called pt, no answer, [facility]. Pt needs an appt.

## 2022-12-08 NOTE — Telephone Encounter (Signed)
TRIAGE VOICEMAIL: Patient states she saw Helmut Muster Copland a few years ago with a condition. She is inquiring if she can rx that cream for her again. Requested return call for details.

## 2022-12-08 NOTE — Telephone Encounter (Signed)
Pt needs to be seen yearly for LS and Rx RF. Need to make sure dosing is adequate for tx. Needs appt.

## 2022-12-09 NOTE — Telephone Encounter (Signed)
Taken care if another encounter.

## 2022-12-09 NOTE — Telephone Encounter (Signed)
Pt has appt on 12/10/22

## 2022-12-10 ENCOUNTER — Encounter: Payer: Self-pay | Admitting: Obstetrics and Gynecology

## 2022-12-10 ENCOUNTER — Ambulatory Visit: Payer: BC Managed Care – PPO | Admitting: Obstetrics and Gynecology

## 2022-12-10 VITALS — BP 138/83 | HR 59 | Ht 67.0 in | Wt 300.0 lb

## 2022-12-10 DIAGNOSIS — L900 Lichen sclerosus et atrophicus: Secondary | ICD-10-CM | POA: Diagnosis not present

## 2022-12-10 DIAGNOSIS — B3731 Acute candidiasis of vulva and vagina: Secondary | ICD-10-CM | POA: Diagnosis not present

## 2022-12-10 LAB — POCT WET PREP WITH KOH
Clue Cells Wet Prep HPF POC: NEGATIVE
KOH Prep POC: NEGATIVE
Trichomonas, UA: NEGATIVE
Yeast Wet Prep HPF POC: POSITIVE

## 2022-12-10 MED ORDER — FLUCONAZOLE 150 MG PO TABS
150.0000 mg | ORAL_TABLET | Freq: Once | ORAL | 0 refills | Status: AC
Start: 2022-12-10 — End: 2022-12-10

## 2022-12-10 NOTE — Patient Instructions (Signed)
I value your feedback and you entrusting us with your care. If you get a [facility] patient survey, I would appreciate you taking the time to let us know about your experience today. Thank you! ? ? ?

## 2022-12-10 NOTE — Progress Notes (Signed)
Alm Bustard, NP   Chief Complaint  Patient presents with   Vaginal Exam    External rawness, red, burns when uses restroom. Some vag itching, abnormal odor.    HPI:      [name] is a 52 y.o. [id number] whose LMP was Patient's last menstrual period was 11/09/2022 (approximate)., presents today for vaginal itching/redness, burning sensation/feeling raw. Sx more recently; triggered by sexual activity, used some lubricants.  Was recently in [facility] with sweating/damp underwear. Denies increased vag d/c. Hx of LS, no clobetasol tx in a few months; no recent LS check. Has annual/pap with PCP coming up; Neg pap/neg HPV DNA 5/20. Has paragard IUD with monthly menses, occas skips periods.   Patient Active Problem List   Diagnosis Date Noted   Migraine 01/29/2017   Vitamin D deficiency 01/29/2017   Abnormal gait 10/01/2016   Muscle weakness 10/01/2016   Foot pain 09/22/2016   Peroneal tendinitis 09/22/2016   Sprain of deltoid ligament of ankle 09/22/2016   Sprain of foot 09/22/2016   Morbid obesity with BMI of 40.0-44.9, adult (HCC) 12/14/2015   Intractable migraine without aura and without status migrainosus 12/12/2015    Past Surgical History:  Procedure Laterality Date   APPENDECTOMY     CESAREAN SECTION     IMAGE GUIDED SINUS SURGERY N/A 03/20/2016   Procedure: IMAGE GUIDED SINUS SURGERY;  Surgeon: Linus Salmons, MD;  Location: [facility];  Service: ENT;  Laterality: N/A;  STRYKER GAVE DISK TO CECE 12-22 KP sent 2nd disk gave to cece 1-3 kp   LASIK     MAXILLARY ANTROSTOMY Bilateral 03/20/2016   Procedure: MAXILLARY ANTROSTOMY WITH TISSUE REMOVAL;  Surgeon: Linus Salmons, MD;  Location: [facility];  Service: ENT;  Laterality: Bilateral;   SEPTOPLASTY N/A 03/20/2016   Procedure: SEPTOPLASTY;  Surgeon: Linus Salmons, MD;  Location: [facility];  Service: ENT;  Laterality: N/A;   TONSILLECTOMY     TURBINATE REDUCTION Bilateral 03/20/2016    Procedure: TURBINATE REDUCTION;  Surgeon: Linus Salmons, MD;  Location: [facility];  Service: ENT;  Laterality: Bilateral;    Family History  Problem Relation Age of Onset   Breast cancer Neg Hx     Social History   Socioeconomic History   Marital status: Married    Spouse name: Not on file   Number of children: Not on file   Years of education: Not on file   Highest education level: Not on file  Occupational History   Not on file  Tobacco Use   Smoking status: Never   Smokeless tobacco: Never  Vaping Use   Vaping status: Never Used  Substance and Sexual Activity   Alcohol use: Yes    Alcohol/week: 1.0 standard drink of alcohol    Types: 1 Glasses of wine per week    Comment: 3x/month   Drug use: Never   Sexual activity: Yes    Birth control/protection: I.U.D.  Other Topics Concern   Not on file  Social History Narrative   Not on file   Social Determinants of Health   Financial Resource Strain: Low Risk  (04/15/2022)   Received from [facility], [facility]   Overall Financial Resource Strain (CARDIA)    Difficulty of Paying Living Expenses: Not hard at all  Food Insecurity: No Food Insecurity (04/15/2022)   Received from [facility]   Hunger Vital Sign    Worried About Running Out of  Food in the Last Year: Never true    Ran Out of Food in the Last Year: Never true  Transportation Needs: No Transportation Needs (04/15/2022)   Received from [facility], [facility]   [facility] - Transportation    Lack of Transportation (Medical): No    Lack of Transportation (Non-Medical): No  Physical Activity: Not on file  Stress: Not on file  Social Connections: Not on file  Intimate Partner Violence: Not on file    Outpatient Medications Prior to Visit  Medication Sig Dispense Refill   acetaminophen (TYLENOL) 500 MG tablet Take 2 tablets (1,000 mg total) by mouth every 6 (six) hours as needed for moderate pain, fever or  headache. 30 tablet 0   ALLEGRA-D ALLERGY & CONGESTION 60-120 MG 12 hr tablet Take 1 tablet by mouth daily.     almotriptan (AXERT) 12.5 MG tablet Take 12.5 mg by mouth as needed for migraine. may repeat in 2 hours if needed     amoxicillin (AMOXIL) 875 MG tablet Take by mouth.     Cholecalciferol 125 MCG (5000 UT) TABS Take 1 tablet by mouth daily.     clobetasol ointment (TEMOVATE) 0.05 % Apply to affected area every night for 4 weeks, then every other night for 4 weeks and then twice a week for 4 weeks or until resolution. 45 g 1   cyclobenzaprine (FLEXERIL) 10 MG tablet Take 10 mg by mouth as needed (tension headaches).     emtricitabine-tenofovir (TRUVADA) 200-300 MG tablet Take 1 tablet by mouth daily.     FLUoxetine (PROZAC) 20 MG capsule Take 40 mg by mouth every morning.     fluticasone (FLONASE) 50 MCG/ACT nasal spray 1 spray daily.     ibuprofen (ADVIL) 200 MG tablet Take by mouth every 6 (six) hours as needed.     Multiple Vitamin (ONE DAILY) tablet Take by mouth.     PARAGARD INTRAUTERINE COPPER IU 1 each by Intrauterine route once.     promethazine (PHENERGAN) 25 MG tablet Take by mouth.     topiramate (TOPAMAX) 25 MG tablet Take 25 mg by mouth daily. am     Na Sulfate-K Sulfate-Mg Sulf 17.5-3.13-1.6 GM/177ML SOLN At 5:00 pm the evening before the procedure: Pour the contents of one bottle of Suprep into the mixing container provided.  Fill the container with cold water to the fill line and mix. DRINK all the liquid in the container. You must drink two (2) more 16 ounces containers of water over the next 1 hour. On the day of the procedure, 5 hours before procedure: Pour the contents of the second bottle of Suprep into the mixing container provided and follow the same instructions. (Patient not taking: Reported on 12/10/2022) 354 mL 0   Semaglutide-Weight Management 0.25 MG/0.5ML SOAJ Inject into the skin. (Patient not taking: Reported on 12/10/2022)     topiramate (TOPAMAX) 50 MG tablet  Take by mouth daily. pm     Cholecalciferol (VITAMIN D-1000 MAX ST PO) Take by mouth daily.     promethazine (PHENERGAN) 12.5 MG tablet Take by mouth as needed.     No facility-administered medications prior to visit.      ROS:  Review of Systems  Constitutional:  Negative for fever.  Gastrointestinal:  Negative for blood in stool, constipation, diarrhea, nausea and vomiting.  Genitourinary:  Positive for dyspareunia and vaginal pain. Negative for dysuria, flank pain, frequency, hematuria, urgency, vaginal bleeding and vaginal discharge.  Musculoskeletal:  Negative for back  pain.  Skin:  Negative for rash.   BREAST: No symptoms   OBJECTIVE:   Vitals:  BP 138/83   Pulse (!) 59   Ht 5\' 7"  (1.702 m)   Wt 300 lb (136.1 kg)   LMP 11/09/2022 (Approximate)   BMI 46.99 kg/m   Physical Exam Vitals reviewed.  Constitutional:      Appearance: She is well-developed.  Pulmonary:     Effort: Pulmonary effort is normal.  Genitourinary:    General: Normal vulva.     Pubic Area: No rash.      Labia:        Right: Rash present. No tenderness or lesion.        Left: Rash present. No tenderness or lesion.      Vagina: Vaginal discharge present. No erythema or tenderness.     Cervix: Normal.     Uterus: Normal. Not enlarged and not tender.      Adnexa: Right adnexa normal and left adnexa normal.       Right: No mass or tenderness.         Left: No mass or tenderness.         Comments: ERYTHEMATOUS SCALE BILAT LABIA MAJORA AND PERIANAL AREA Musculoskeletal:        General: Normal range of motion.     Cervical back: Normal range of motion.  Skin:    General: Skin is warm and dry.  Neurological:     General: No focal deficit present.     Mental Status: She is alert and oriented to person, place, and time.  Psychiatric:        Mood and Affect: Mood normal.        Behavior: Behavior normal.        Thought Content: Thought content normal.        Judgment: Judgment normal.      Results: Results for orders placed or performed in visit on 12/10/22 (from the past 24 hour(s))  POCT Wet Prep with KOH     Status: Abnormal   Collection Time: 12/10/22  2:50 PM  Result Value Ref Range   Trichomonas, UA Negative    Clue Cells Wet Prep HPF POC neg    Epithelial Wet Prep HPF POC     Yeast Wet Prep HPF POC pos    Bacteria Wet Prep HPF POC     RBC Wet Prep HPF POC     WBC Wet Prep HPF POC     KOH Prep POC Negative Negative     Assessment/Plan: Candidal vaginitis - Plan: fluconazole (DIFLUCAN) 150 MG tablet, POCT Wet Prep with KOH; pos sx, exam and wet prep. Rx diflucan. Hard to differentiate yeast vag vs LS.   Lichen sclerosus--RTO in 2 wks after yeast tx for LS f/u to better assess affected areas vs yeast vag.    Meds ordered this encounter  Medications   fluconazole (DIFLUCAN) 150 MG tablet    Sig: Take 1 tablet (150 mg total) by mouth once for 1 dose. May repeat in 3 days if still having symptoms    Dispense:  2 tablet    Refill:  0    Order Specific Question:   Supervising Provider    Answer:   Hildred Laser [[id number]]      Return in about 2 weeks (around 12/24/2022) for vaginitis f/u.  Kyaire Gruenewald B. Nikkie Liming, PA-C 12/10/2022 2:51 PM

## 2022-12-22 ENCOUNTER — Ambulatory Visit: Payer: BC Managed Care – PPO | Admitting: Obstetrics and Gynecology

## 2022-12-22 ENCOUNTER — Encounter: Payer: Self-pay | Admitting: Obstetrics and Gynecology

## 2022-12-22 VITALS — BP 134/83 | HR 63 | Ht 67.0 in | Wt 301.0 lb

## 2022-12-22 DIAGNOSIS — L900 Lichen sclerosus et atrophicus: Secondary | ICD-10-CM

## 2022-12-22 DIAGNOSIS — B356 Tinea cruris: Secondary | ICD-10-CM | POA: Diagnosis not present

## 2022-12-22 MED ORDER — CLOBETASOL PROPIONATE 0.05 % EX OINT: TOPICAL_OINTMENT | CUTANEOUS | 0 refills | Status: AC

## 2022-12-22 MED ORDER — CLOTRIMAZOLE-BETAMETHASONE 1-0.05 % EX CREA
TOPICAL_CREAM | CUTANEOUS | 0 refills | Status: AC
Start: 2022-12-22 — End: ?

## 2022-12-22 NOTE — Progress Notes (Signed)
Alm Bustard, NP   Chief Complaint  Patient presents with   Follow-up    Sx are much better than last time    HPI:      [name] is a 52 y.o. [id number] whose LMP was Patient's last menstrual period was 11/09/2022 (approximate)., presents today for vaginitis f/u from 9/24.  Was having vaginal itching/redness, burning sensation/feeling raw. Was recently in [facility] with sweating/damp underwear. Exam c/w with yeast vag with pos wet prep. Treated with diflucan x 2 with much sx improvement. Hx of LS (confirmed on bx), no clobetasol tx in a few months so hard to know which was yeast vag vs LS. Pt here to f/u.   Patient Active Problem List   Diagnosis Date Noted   Lichen sclerosus 12/22/2022   Migraine 01/29/2017   Vitamin D deficiency 01/29/2017   Abnormal gait 10/01/2016   Muscle weakness 10/01/2016   Foot pain 09/22/2016   Peroneal tendinitis 09/22/2016   Sprain of deltoid ligament of ankle 09/22/2016   Sprain of foot 09/22/2016   Morbid obesity with BMI of 40.0-44.9, adult (HCC) 12/14/2015   Intractable migraine without aura and without status migrainosus 12/12/2015    Past Surgical History:  Procedure Laterality Date   APPENDECTOMY     CESAREAN SECTION     IMAGE GUIDED SINUS SURGERY N/A 03/20/2016   Procedure: IMAGE GUIDED SINUS SURGERY;  Surgeon: Linus Salmons, MD;  Location: [facility];  Service: ENT;  Laterality: N/A;  STRYKER GAVE DISK TO CECE 12-22 KP sent 2nd disk gave to cece 1-3 kp   LASIK     MAXILLARY ANTROSTOMY Bilateral 03/20/2016   Procedure: MAXILLARY ANTROSTOMY WITH TISSUE REMOVAL;  Surgeon: Linus Salmons, MD;  Location: [facility];  Service: ENT;  Laterality: Bilateral;   SEPTOPLASTY N/A 03/20/2016   Procedure: SEPTOPLASTY;  Surgeon: Linus Salmons, MD;  Location: [facility];  Service: ENT;  Laterality: N/A;   TONSILLECTOMY     TURBINATE REDUCTION Bilateral 03/20/2016   Procedure: TURBINATE REDUCTION;  Surgeon: Linus Salmons, MD;  Location: [facility];  Service: ENT;  Laterality: Bilateral;    Family History  Problem Relation Age of Onset   Breast cancer Neg Hx     Social History   Socioeconomic History   Marital status: Married    Spouse name: Not on file   Number of children: Not on file   Years of education: Not on file   Highest education level: Not on file  Occupational History   Not on file  Tobacco Use   Smoking status: Never   Smokeless tobacco: Never  Vaping Use   Vaping status: Never Used  Substance and Sexual Activity   Alcohol use: Yes    Alcohol/week: 1.0 standard drink of alcohol    Types: 1 Glasses of wine per week    Comment: 3x/month   Drug use: Never   Sexual activity: Yes    Birth control/protection: I.U.D.  Other Topics Concern   Not on file  Social History Narrative   Not on file   Social Determinants of Health   Financial Resource Strain: Low Risk  (04/15/2022)   Received from [facility]   Overall Financial Resource Strain (CARDIA)    Difficulty of Paying Living Expenses: Not hard at all  Food Insecurity: No Food Insecurity (04/15/2022)   Received from [facility], [facility]   Hunger Vital Sign    Worried About Running  Out of Food in the Last Year: Never true    Ran Out of Food in the Last Year: Never true  Transportation Needs: No Transportation Needs (04/15/2022)   Received from [facility], [facility]   [facility] - Transportation    Lack of Transportation (Medical): No    Lack of Transportation (Non-Medical): No  Physical Activity: Not on file  Stress: Not on file  Social Connections: Not on file  Intimate Partner Violence: Not on file    Outpatient Medications Prior to Visit  Medication Sig Dispense Refill   acetaminophen (TYLENOL) 500 MG tablet Take 2 tablets (1,000 mg total) by mouth every 6 (six) hours as needed for moderate pain, fever or headache. 30 tablet 0   ALLEGRA-D ALLERGY & CONGESTION  60-120 MG 12 hr tablet Take 1 tablet by mouth daily.     almotriptan (AXERT) 12.5 MG tablet Take 12.5 mg by mouth as needed for migraine. may repeat in 2 hours if needed     Cholecalciferol 125 MCG (5000 UT) TABS Take 1 tablet by mouth daily.     cyclobenzaprine (FLEXERIL) 10 MG tablet Take 10 mg by mouth as needed (tension headaches).     emtricitabine-tenofovir (TRUVADA) 200-300 MG tablet Take 1 tablet by mouth daily.     FLUoxetine (PROZAC) 20 MG capsule Take 40 mg by mouth every morning.     fluticasone (FLONASE) 50 MCG/ACT nasal spray 1 spray daily.     ibuprofen (ADVIL) 200 MG tablet Take by mouth every 6 (six) hours as needed.     Multiple Vitamin (ONE DAILY) tablet Take by mouth.     PARAGARD INTRAUTERINE COPPER IU 1 each by Intrauterine route once.     promethazine (PHENERGAN) 25 MG tablet Take by mouth.     topiramate (TOPAMAX) 25 MG tablet Take 25 mg by mouth daily. am     clobetasol ointment (TEMOVATE) 0.05 % Apply to affected area every night for 4 weeks, then every other night for 4 weeks and then twice a week for 4 weeks or until resolution. 45 g 1   Na Sulfate-K Sulfate-Mg Sulf 17.5-3.13-1.6 GM/177ML SOLN At 5:00 pm the evening before the procedure: Pour the contents of one bottle of Suprep into the mixing container provided.  Fill the container with cold water to the fill line and mix. DRINK all the liquid in the container. You must drink two (2) more 16 ounces containers of water over the next 1 hour. On the day of the procedure, 5 hours before procedure: Pour the contents of the second bottle of Suprep into the mixing container provided and follow the same instructions. (Patient not taking: Reported on 12/10/2022) 354 mL 0   Semaglutide-Weight Management 0.25 MG/0.5ML SOAJ Inject into the skin. (Patient not taking: Reported on 12/10/2022)     topiramate (TOPAMAX) 50 MG tablet Take by mouth daily. pm     No facility-administered medications prior to visit.      ROS:  Review of  Systems  Constitutional:  Negative for fever.  Gastrointestinal:  Negative for blood in stool, constipation, diarrhea, nausea and vomiting.  Genitourinary:  Negative for dyspareunia, dysuria, flank pain, frequency, hematuria, urgency, vaginal bleeding, vaginal discharge and vaginal pain.  Musculoskeletal:  Negative for back pain.  Skin:  Negative for rash.   BREAST: No symptoms   OBJECTIVE:   Vitals:  BP 134/83   Pulse 63   Ht 5\' 7"  (1.702 m)   Wt (!) 301 lb (136.5 kg)  LMP 11/09/2022 (Approximate)   BMI 47.14 kg/m   Physical Exam Vitals reviewed.  Constitutional:      Appearance: She is well-developed.  Pulmonary:     Effort: Pulmonary effort is normal.  Genitourinary:    General: Normal vulva.     Pubic Area: No rash.      Labia:        Right: No rash, tenderness or lesion.        Left: No rash, tenderness or lesion.     Musculoskeletal:        General: Normal range of motion.     Cervical back: Normal range of motion.  Skin:    General: Skin is warm and dry.  Neurological:     General: No focal deficit present.     Mental Status: She is alert and oriented to person, place, and time.  Psychiatric:        Mood and Affect: Mood normal.        Behavior: Behavior normal.        Thought Content: Thought content normal.        Judgment: Judgment normal.     Assessment/Plan: Lichen sclerosus - Plan: clobetasol ointment (TEMOVATE) 0.05 %; 2 areas of LS. Rx RF clobetasol. Use at bedtime for 4 wks, then every other day for 4 wks, then 1-2 times wkly as maintenance. F/u prn  Tinea cruris - Plan: clotrimazole-betamethasone (LOTRISONE) cream; Rx lotrison crm BID for 2 wks. Keep dry. F/u prn.    Meds ordered this encounter  Medications   clotrimazole-betamethasone (LOTRISONE) cream    Sig: Apply externally BID for 2 wks    Dispense:  45 g    Refill:  0    Order Specific Question:   Supervising Provider    Answer:   Hildred Laser [[id number]]   clobetasol ointment  (TEMOVATE) 0.05 %    Sig: Apply to affected area every night for 4 weeks, then every other night for 4 weeks and then once or twice a week as maintenance    Dispense:  30 g    Refill:  0    Order Specific Question:   Supervising Provider    Answer:   Hildred Laser [[id number]]      Return if symptoms worsen or fail to improve.  Leeann Bady B. Jacalynn Buzzell, PA-C 12/22/2022 4:49 PM

## 2022-12-22 NOTE — Patient Instructions (Signed)
I value your feedback and you entrusting us with your care. If you get a [facility] patient survey, I would appreciate you taking the time to let us know about your experience today. Thank you! ? ? ?

## 2022-12-28 ENCOUNTER — Other Ambulatory Visit: Payer: Self-pay | Admitting: Internal Medicine

## 2022-12-28 DIAGNOSIS — Z1231 Encounter for screening mammogram for malignant neoplasm of breast: Secondary | ICD-10-CM

## 2023-01-06 ENCOUNTER — Telehealth: Payer: Self-pay

## 2023-01-06 ENCOUNTER — Ambulatory Visit
Admission: RE | Admit: 2023-01-06 | Discharge: 2023-01-06 | Disposition: A | Discharge status: Home / Self Care | Payer: BC Managed Care – PPO | Source: Ambulatory Visit | Attending: Internal Medicine | Admitting: Internal Medicine

## 2023-01-06 DIAGNOSIS — Z1231 Encounter for screening mammogram for malignant neoplasm of breast: Secondary | ICD-10-CM | POA: Insufficient documentation

## 2023-01-06 NOTE — Telephone Encounter (Signed)
Pt calling; has appt c [facility] next week for abnl pap that was done at her PCP (they referred her to [place]); wants to know what to expect; what is HPV.  Adv pt [facility] would repeat the pap smear and talk with her about the HPV; adv pt to use condom with sex; hsb is getting checked for HPV today.  Pt reassured.

## 2023-01-11 NOTE — Telephone Encounter (Signed)
I called pt to let her know pap should be repeated in 1 yr per guidelines and she said she is keeping her appointment because she was advised to follow up with [place] and she has question regarding results.

## 2023-01-12 ENCOUNTER — Ambulatory Visit: Payer: BC Managed Care – PPO | Admitting: Obstetrics and Gynecology

## 2023-01-12 NOTE — Telephone Encounter (Signed)
Spoke with pt. Pt's last pap was ASCUS/pos HPV DNA with PCP. PCP referred here for f/u. Pt has never had abn pap before. Per ASCCP guidelines, pt to have repeat pap in 1 yr. Pt's questions about sexual practices and HPV answered.  LS sx improved with clobetasol. F/u in 1 yr, sooner prn.

## 2023-11-29 ENCOUNTER — Ambulatory Visit: Admit: 2023-11-29 | Payer: Self-pay | Admitting: Gastroenterology

## 2023-11-29 SURGERY — COLONOSCOPY
Anesthesia: General

## 2023-12-08 ENCOUNTER — Ambulatory Visit: Admitting: Anesthesiology

## 2023-12-08 ENCOUNTER — Ambulatory Visit
Admission: RE | Admit: 2023-12-08 | Discharge: 2023-12-08 | Disposition: A | Discharge status: Home / Self Care | Payer: Self-pay | Attending: Gastroenterology | Admitting: Gastroenterology

## 2023-12-08 ENCOUNTER — Encounter: Payer: Self-pay | Admitting: Gastroenterology

## 2023-12-08 ENCOUNTER — Encounter
Admission: RE | Disposition: A | Discharge status: Home / Self Care | Payer: Self-pay | Source: Home / Self Care | Attending: Gastroenterology

## 2023-12-08 DIAGNOSIS — G43909 Migraine, unspecified, not intractable, without status migrainosus: Secondary | ICD-10-CM | POA: Insufficient documentation

## 2023-12-08 DIAGNOSIS — D124 Benign neoplasm of descending colon: Secondary | ICD-10-CM | POA: Insufficient documentation

## 2023-12-08 DIAGNOSIS — Z1211 Encounter for screening for malignant neoplasm of colon: Secondary | ICD-10-CM | POA: Insufficient documentation

## 2023-12-08 DIAGNOSIS — D123 Benign neoplasm of transverse colon: Secondary | ICD-10-CM | POA: Insufficient documentation

## 2023-12-08 HISTORY — PX: POLYPECTOMY: SHX149

## 2023-12-08 HISTORY — PX: COLONOSCOPY: SHX5424

## 2023-12-08 SURGERY — COLONOSCOPY
Anesthesia: General

## 2023-12-08 MED ORDER — LIDOCAINE HCL (CARDIAC) PF 100 MG/5ML IV SOSY
PREFILLED_SYRINGE | INTRAVENOUS | Status: DC | PRN
  Administered 2023-12-08: 100 mg via INTRAVENOUS

## 2023-12-08 MED ORDER — DEXMEDETOMIDINE HCL IN NACL 80 MCG/20ML IV SOLN
INTRAVENOUS | Status: DC | PRN
  Administered 2023-12-08: 8 ug via INTRAVENOUS
  Administered 2023-12-08: 12 ug via INTRAVENOUS

## 2023-12-08 MED ORDER — PROPOFOL 10 MG/ML IV BOLUS
INTRAVENOUS | Status: DC | PRN
  Administered 2023-12-08: 50 mg via INTRAVENOUS
  Administered 2023-12-08: 30 mg via INTRAVENOUS

## 2023-12-08 MED ORDER — SODIUM CHLORIDE 0.9 % IV SOLN
INTRAVENOUS | Status: DC
  Administered 2023-12-08: 20 mL/h via INTRAVENOUS

## 2023-12-08 MED ORDER — LIDOCAINE HCL (PF) 2 % IJ SOLN
INTRAMUSCULAR | Status: AC
  Filled 2023-12-08: qty 5

## 2023-12-08 MED ORDER — PROPOFOL 500 MG/50ML IV EMUL
INTRAVENOUS | Status: DC | PRN
  Administered 2023-12-08: 50 ug/kg/min via INTRAVENOUS

## 2023-12-08 NOTE — Transfer of Care (Signed)
 Immediate Anesthesia Transfer of Care Note  Patient: Judith Barrett  Procedure(s) Performed: COLONOSCOPY POLYPECTOMY, INTESTINE  Patient Location: PACU  Anesthesia Type:General  Level of Consciousness: sedated  Airway & Oxygen Therapy: Patient Spontanous Breathing  Post-op Assessment: Report given to RN and Post -op Vital signs reviewed and stable  Post vital signs: Reviewed and stable  Last Vitals:  Vitals Value Taken Time  BP 99/54 12/08/23 08:57  Temp    Pulse 73 12/08/23 08:57  Resp 24 12/08/23 08:57  SpO2 97 % 12/08/23 08:57  Vitals shown include unfiled device data.  Last Pain:  Vitals:   12/08/23 0857  TempSrc:   PainSc: 0-No pain         Complications: No notable events documented.

## 2023-12-08 NOTE — Anesthesia Postprocedure Evaluation (Signed)
 Anesthesia Post Note  Patient: Judith Barrett  Procedure(s) Performed: COLONOSCOPY POLYPECTOMY, INTESTINE  Patient location during evaluation: PACU Anesthesia Type: General Level of consciousness: awake and alert Pain management: pain level controlled Vital Signs Assessment: post-procedure vital signs reviewed and stable Respiratory status: spontaneous breathing, nonlabored ventilation, respiratory function stable and patient connected to nasal cannula oxygen Cardiovascular status: blood pressure returned to baseline and stable Postop Assessment: no apparent nausea or vomiting Anesthetic complications: no   No notable events documented.   Last Vitals:  Vitals:   12/08/23 0754 12/08/23 0857  BP: (!) 164/90 (!) 99/54  Pulse: 88 79  Resp: 20 (!) 24  Temp: (!) 36.3 C   SpO2: 98% 97%    Last Pain:  Vitals:   12/08/23 0857  TempSrc:   PainSc: 0-No pain                 Lynwood KANDICE Clause

## 2023-12-08 NOTE — Anesthesia Preprocedure Evaluation (Signed)
 Anesthesia Evaluation  Patient identified by MRN, date of birth, ID band Patient awake    Reviewed: Allergy & Precautions, H&P , NPO status , Patient's Chart, lab work & pertinent test results, reviewed documented beta blocker date and time   Airway Mallampati: II   Neck ROM: full    Dental  (+) Poor Dentition   Pulmonary neg pulmonary ROS   Pulmonary exam normal        Cardiovascular negative cardio ROS Normal cardiovascular exam Rhythm:regular Rate:Normal     Neuro/Psych  Headaches  negative psych ROS   GI/Hepatic negative GI ROS, Neg liver ROS,,,  Endo/Other  negative endocrine ROS    Renal/GU negative Renal ROS  negative genitourinary   Musculoskeletal   Abdominal   Peds  Hematology negative hematology ROS (+)   Anesthesia Other Findings Past Medical History: No date: Bartholin cyst No date: Migraines     Comment:  1x month, migraines, sinus, stress 03/23/2022: RSV (respiratory syncytial virus infection) Past Surgical History: No date: APPENDECTOMY No date: BREAST BIOPSY; Right     Comment:  X 2 Benign No date: CESAREAN SECTION 03/20/2016: IMAGE GUIDED SINUS SURGERY; N/A     Comment:  Procedure: IMAGE GUIDED SINUS SURGERY;  Surgeon: Chinita Hasten, MD;  Location: [facility];  Service:               ENT;  Laterality: N/A;  STRYKER GAVE DISK TO CECE 12-22               KP sent 2nd disk gave to cece 1-3 kp No date: LASIK 03/20/2016: MAXILLARY ANTROSTOMY; Bilateral     Comment:  Procedure: MAXILLARY ANTROSTOMY WITH TISSUE REMOVAL;                Surgeon: Chinita Hasten, MD;  Location: [facility]               [facility];  Service: ENT;  Laterality: Bilateral; 03/20/2016: SEPTOPLASTY; N/A     Comment:  Procedure: SEPTOPLASTY;  Surgeon: Chinita Hasten, MD;                Location: [facility];  Service: ENT;                Laterality: N/A; No date:  TONSILLECTOMY 03/20/2016: TURBINATE REDUCTION; Bilateral     Comment:  Procedure: TURBINATE REDUCTION;  Surgeon: Chinita Hasten, MD;  Location: [facility];  Service:               ENT;  Laterality: Bilateral;   Reproductive/Obstetrics negative OB ROS                              Anesthesia Physical Anesthesia Plan  ASA: 2  Anesthesia Plan: General   Post-op Pain Management:    Induction:   PONV Risk Score and Plan:   Airway Management Planned:   Additional Equipment:   Intra-op Plan:   Post-operative Plan:   Informed Consent: I have reviewed the patients History and Physical, chart, labs and discussed the procedure including the risks, benefits and alternatives for the proposed anesthesia with the patient or authorized representative who has indicated his/her understanding and acceptance.     Dental Advisory Given  Plan Discussed with: CRNA  Anesthesia Plan Comments:  Anesthesia Quick Evaluation

## 2023-12-08 NOTE — H&P (Signed)
 Judith JONELLE Brooklyn, MD [facility], [facility] [address]  [place], [place] [address]  Main: [phone] Fax:  [phone] Pager: [phone]   Primary Care Physician:  Sherial Bail, MD Primary Gastroenterologist:  Dr. Corinn JONELLE Barrett  Pre-Procedure History & Physical: HPI:  Judith Barrett is a 53 y.o. female is here for an colonoscopy.   Past Medical History:  Diagnosis Date   Bartholin cyst    Migraines    1x month, migraines, sinus, stress   RSV (respiratory syncytial virus infection) 03/23/2022    Past Surgical History:  Procedure Laterality Date   APPENDECTOMY     BREAST BIOPSY Right    X 2 Benign   CESAREAN SECTION     IMAGE GUIDED SINUS SURGERY N/A 03/20/2016   Procedure: IMAGE GUIDED SINUS SURGERY;  Surgeon: Chinita Hasten, MD;  Location: [facility];  Service: ENT;  Laterality: N/A;  STRYKER GAVE DISK TO CECE 12-22 KP sent 2nd disk gave to cece 1-3 kp   LASIK     MAXILLARY ANTROSTOMY Bilateral 03/20/2016   Procedure: MAXILLARY ANTROSTOMY WITH TISSUE REMOVAL;  Surgeon: Chinita Hasten, MD;  Location: [facility];  Service: ENT;  Laterality: Bilateral;   SEPTOPLASTY N/A 03/20/2016   Procedure: SEPTOPLASTY;  Surgeon: Chinita Hasten, MD;  Location: [facility];  Service: ENT;  Laterality: N/A;   TONSILLECTOMY     TURBINATE REDUCTION Bilateral 03/20/2016   Procedure: TURBINATE REDUCTION;  Surgeon: Chinita Hasten, MD;  Location: [facility];  Service: ENT;  Laterality: Bilateral;    Prior to Admission medications   Medication Sig Start Date End Date Taking? Authorizing Provider  acetaminophen  (TYLENOL ) 500 MG tablet Take 2 tablets (1,000 mg total) by mouth every 6 (six) hours as needed for moderate pain, fever or headache. 01/11/15  Yes Betancourt, Ellouise LABOR, NP  ALLEGRA-D ALLERGY & CONGESTION 60-120 MG 12 hr tablet Take 1 tablet by mouth daily. 04/22/20  Yes [provider]  almotriptan  (AXERT) 12.5 MG tablet Take 12.5 mg by mouth as needed for migraine. may repeat in 2 hours if needed   Yes [provider]  Cholecalciferol 125 MCG (5000 UT) TABS Take 1 tablet by mouth daily.   Yes [provider]  clobetasol  ointment (TEMOVATE ) 0.05 % Apply to affected area every night for 4 weeks, then every other night for 4 weeks and then once or twice a week as maintenance 12/22/22  Yes Copland, Alicia B, PA-C  clotrimazole -betamethasone  (LOTRISONE ) cream Apply externally BID for 2 wks 12/22/22  Yes Copland, Alicia B, PA-C  cyclobenzaprine (FLEXERIL) 10 MG tablet Take 10 mg by mouth as needed (tension headaches).   Yes [provider]  emtricitabine-tenofovir (TRUVADA) 200-300 MG tablet Take 1 tablet by mouth daily. 08/03/22  Yes [provider]  FLUoxetine (PROZAC) 20 MG capsule Take 40 mg by mouth every morning. 09/01/20  Yes [provider]  fluticasone  (FLONASE ) 50 MCG/ACT nasal spray 1 spray daily.   Yes [provider]  ibuprofen (ADVIL) 200 MG tablet Take by mouth every 6 (six) hours as needed.   Yes [provider]  Multiple Vitamin (ONE DAILY) tablet Take by mouth.   Yes [provider]  PARAGARD  INTRAUTERINE COPPER  IU 1 each by Intrauterine route once. 08/14/21  Yes [provider]  promethazine (PHENERGAN) 25 MG tablet Take by mouth.   Yes [provider]  topiramate (TOPAMAX) 25 MG tablet Take 25 mg by mouth daily. am 09/01/20  Yes [provider]  Na Sulfate-K Sulfate-Mg Sulf 17.5-3.13-1.6 GM/177ML SOLN At 5:00 pm the evening before the procedure: Pour the contents of one bottle of Suprep into the mixing container provided.  Fill the container with cold water to the fill line and mix. DRINK all the liquid in the container. You must drink two (2) more 16 ounces containers of water over the next 1 hour. On the day of the procedure, 5 hours before procedure: Pour the contents of the second bottle  of Suprep into the mixing container provided and follow the same instructions. Patient not taking: Reported on 12/10/2022 01/06/22   Jinny Carmine, MD  Semaglutide-Weight Management 0.25 MG/0.5ML SOAJ Inject into the skin. Patient not taking: Reported on 12/10/2022 10/06/22   [provider]  topiramate (TOPAMAX) 50 MG tablet Take by mouth daily. pm 08/13/20 08/13/21  [provider]    Allergies as of 11/24/2023   (No Known Allergies)    Family History  Problem Relation Age of Onset   Breast cancer Neg Hx     Social History   Socioeconomic History   Marital status: Married    Spouse name: Not on file   Number of children: Not on file   Years of education: Not on file   Highest education level: Not on file  Occupational History   Not on file  Tobacco Use   Smoking status: Never   Smokeless tobacco: Never  Vaping Use   Vaping status: Never Used  Substance and Sexual Activity   Alcohol use: Yes    Alcohol/week: 1.0 standard drink of alcohol    Types: 1 Glasses of wine per week    Comment: 3x/month   Drug use: Never   Sexual activity: Yes    Birth control/protection: I.U.D.  Other Topics Concern   Not on file  Social History Narrative   Not on file   Social Drivers of Health   Financial Resource Strain: Low Risk  (06/29/2023)   Received from [facility]   Overall Financial Resource Strain (CARDIA)    Difficulty of Paying Living Expenses: Not hard at all  Food Insecurity: No Food Insecurity (06/29/2023)   Received from [facility]   Hunger Vital Sign    Within the past 12 months, you worried that your food would run out before you got the money to buy more.: Never true    Within the past 12 months, the food you bought just didn't last and you didn't have money to get more.: Never true  Transportation Needs: No Transportation Needs (06/29/2023)   Received from [facility] - Transportation     In the past 12 months, has lack of transportation kept you from medical appointments or from getting medications?: No    Lack of Transportation (Non-Medical): No  Physical Activity: Not on file  Stress: Not on file  Social Connections: Not on file  Intimate Partner Violence: Not on file    Review of Systems: See HPI, otherwise negative ROS  Physical Exam: BP (!) 164/90   Pulse 88   Temp (!) 97.4 F (36.3 C) (Temporal)   Resp 20   Ht 5' 7 (1.702 m)   Wt 131.5 kg   SpO2 98%   BMI 45.39 kg/m  General:   Alert,  pleasant and cooperative in NAD Head:  Normocephalic and atraumatic. Neck:  Supple; no masses or thyromegaly. Lungs:  Clear throughout to auscultation.    Heart:  Regular  rate and rhythm. Abdomen:  Soft, nontender and nondistended. Normal bowel sounds, without guarding, and without rebound.   Neurologic:  Alert and  oriented x4;  grossly normal neurologically.  Impression/Plan: Judith Barrett is here for an colonoscopy to be performed for colon cancer screening  Risks, benefits, limitations, and alternatives regarding  colonoscopy have been reviewed with the patient.  Questions have been answered.  All parties agreeable.   Judith Brooklyn, MD  12/08/2023, 7:58 AM

## 2023-12-08 NOTE — Op Note (Signed)
 [facility] Patient Name: Judith Barrett Procedure Date: 12/08/2023 8:31 AM MRN: [id number] Account #: [id number] Date of Birth: 1970-04-13 Admit Type: Outpatient Age: 53 Room: [facility] ENDO ROOM 4 Gender: Female Note Status: Finalized Instrument Name: Colon Scope [id number] Procedure:             Colonoscopy Indications:           Screening for colorectal malignant neoplasm, This is                         the patient's first colonoscopy Providers:             Corinn Jess Brooklyn MD, MD Referring MD:          Lavenia Beaver, MD (Referring MD) Medicines:             General Anesthesia Complications:         No immediate complications. Estimated blood loss: None. Procedure:             Pre-Anesthesia Assessment:                        - Prior to the procedure, a History and Physical was                         performed, and patient medications and allergies were                         reviewed. The patient is competent. The risks and                         benefits of the procedure and the sedation options and                         risks were discussed with the patient. All questions                         were answered and informed consent was obtained.                         Patient identification and proposed procedure were                         verified by the physician, the nurse, the                         anesthesiologist, the anesthetist and the technician                         in the pre-procedure area in the procedure room in the                         endoscopy suite. Mental Status Examination: alert and                         oriented. Airway Examination: normal oropharyngeal                         airway and neck mobility. Respiratory Examination:  clear to auscultation. CV Examination: normal.                         Prophylactic Antibiotics: The patient does not require                          prophylactic antibiotics. Prior Anticoagulants: The                         patient has taken no anticoagulant or antiplatelet                         agents. ASA Grade Assessment: II - A patient with mild                         systemic disease. After reviewing the risks and                         benefits, the patient was deemed in satisfactory                         condition to undergo the procedure. The anesthesia                         plan was to use general anesthesia. Immediately prior                         to administration of medications, the patient was                         re-assessed for adequacy to receive sedatives. The                         heart rate, respiratory rate, oxygen saturations,                         blood pressure, adequacy of pulmonary ventilation, and                         response to care were monitored throughout the                         procedure. The physical status of the patient was                         re-assessed after the procedure.                        After obtaining informed consent, the colonoscope was                         passed under direct vision. Throughout the procedure,                         the patient's blood pressure, pulse, and oxygen                         saturations were monitored continuously. The was  introduced through the anus and advanced to the the                         cecum, identified by appendiceal orifice and ileocecal                         valve. The colonoscopy was performed without                         difficulty. The patient tolerated the procedure well.                         The quality of the bowel preparation was evaluated                         using the BBPS [facility] Bowel Preparation Scale) with                         scores of: Right Colon = 3, Transverse Colon = 3 and                         Left Colon = 3 (entire mucosa seen well with no                          residual staining, small fragments of stool or opaque                         liquid). The total BBPS score equals 9. The ileocecal                         valve, appendiceal orifice, and rectum were                         photographed. Findings:      The perianal and digital rectal examinations were normal. Pertinent       negatives include normal sphincter tone and no palpable rectal lesions.      A diminutive polyp was found in the transverse colon. The polyp was       sessile. The polyp was removed with a jumbo cold forceps. Resection and       retrieval were complete.      A 4 mm polyp was found in the descending colon. The polyp was sessile.       The polyp was removed with a cold snare. Resection and retrieval were       complete. Estimated blood loss: none.      The retroflexed view of the distal rectum and anal verge was normal and       showed no anal or rectal abnormalities. Impression:            - One diminutive polyp in the transverse colon,                         removed with a jumbo cold forceps. Resected and                         retrieved.                        -  One 4 mm polyp in the descending colon, removed with                         a cold snare. Resected and retrieved.                        - The distal rectum and anal verge are normal on                         retroflexion view. Recommendation:        - Discharge patient to home (with escort).                        - Resume previous diet today.                        - Continue present medications.                        - Await pathology results.                        - Repeat colonoscopy in 5-10 years for surveillance                         based on pathology results. Procedure Code(s):     --- Professional ---                        [phone], Colonoscopy, flexible; with removal of                         tumor(s), polyp(s), or other lesion(s) by snare                         technique                         [id number], 59, Colonoscopy, flexible; with biopsy, single                         or multiple Diagnosis Code(s):     --- Professional ---                        Z12.11, Encounter for screening for malignant neoplasm                         of colon                        D12.3, Benign neoplasm of transverse colon (hepatic                         flexure or splenic flexure)                        D12.4, Benign neoplasm of descending colon CPT copyright 2022 [employer]. All rights reserved. The codes documented in this report are preliminary and upon coder review may  be revised to meet current compliance requirements. Dr. Corinn Brooklyn Corinn Jess Brooklyn MD, MD 12/08/2023 8:54:22 AM This  report has been signed electronically. Number of Addenda: 0 Note Initiated On: 12/08/2023 8:31 AM Scope Withdrawal Time: 0 hours 7 minutes 54 seconds  Total Procedure Duration: 0 hours 10 minutes 37 seconds  Estimated Blood Loss:  Estimated blood loss: none.      [facility]

## 2023-12-09 LAB — SURGICAL PATHOLOGY

## 2023-12-13 ENCOUNTER — Ambulatory Visit: Payer: Self-pay | Admitting: Gastroenterology

## 2024-01-13 ENCOUNTER — Other Ambulatory Visit: Payer: Self-pay | Admitting: Internal Medicine

## 2024-01-13 DIAGNOSIS — Z1231 Encounter for screening mammogram for malignant neoplasm of breast: Secondary | ICD-10-CM

## 2024-02-21 ENCOUNTER — Ambulatory Visit
Admission: RE | Admit: 2024-02-21 | Discharge: 2024-02-21 | Disposition: A | Source: Ambulatory Visit | Attending: Internal Medicine | Admitting: Internal Medicine

## 2024-02-21 DIAGNOSIS — Z1231 Encounter for screening mammogram for malignant neoplasm of breast: Secondary | ICD-10-CM
